# Patient Record
Sex: Female | Born: 1980 | Race: White | Hispanic: No | Marital: Married | Smoking: Never smoker
Health system: Southern US, Community
[De-identification: ages and names within clinical notes are randomized; demographics above are authoritative.]

## PROBLEM LIST (undated history)

## (undated) DIAGNOSIS — I471 Supraventricular tachycardia, unspecified: Secondary | ICD-10-CM

## (undated) DIAGNOSIS — I341 Nonrheumatic mitral (valve) prolapse: Secondary | ICD-10-CM

## (undated) DIAGNOSIS — I4891 Unspecified atrial fibrillation: Secondary | ICD-10-CM

## (undated) DIAGNOSIS — N809 Endometriosis, unspecified: Secondary | ICD-10-CM

## (undated) HISTORY — DX: Endometriosis, unspecified: N80.9

## (undated) HISTORY — PX: ABDOMINAL HYSTERECTOMY: SHX81

## (undated) HISTORY — PX: APPENDECTOMY: SHX54

## (undated) HISTORY — PX: MITRAL VALVE SURGERY: SHX714

---

## 2004-01-27 ENCOUNTER — Emergency Department: Payer: Self-pay | Admitting: General Practice

## 2004-04-15 ENCOUNTER — Emergency Department: Payer: Self-pay | Admitting: Emergency Medicine

## 2005-04-15 ENCOUNTER — Ambulatory Visit: Payer: Self-pay | Admitting: Urology

## 2005-10-09 ENCOUNTER — Emergency Department: Payer: Self-pay | Admitting: Emergency Medicine

## 2005-10-10 ENCOUNTER — Ambulatory Visit: Payer: Self-pay | Admitting: Emergency Medicine

## 2006-01-07 ENCOUNTER — Emergency Department (HOSPITAL_COMMUNITY): Admission: EM | Admit: 2006-01-07 | Discharge: 2006-01-07 | Payer: Self-pay | Admitting: Emergency Medicine

## 2006-02-12 ENCOUNTER — Emergency Department: Payer: Self-pay | Admitting: Emergency Medicine

## 2006-03-06 ENCOUNTER — Ambulatory Visit: Payer: Self-pay | Admitting: Internal Medicine

## 2006-03-17 ENCOUNTER — Ambulatory Visit: Payer: Self-pay | Admitting: Cardiovascular Disease

## 2006-11-27 ENCOUNTER — Ambulatory Visit: Payer: Self-pay | Admitting: Internal Medicine

## 2006-12-11 ENCOUNTER — Ambulatory Visit: Payer: Self-pay | Admitting: Internal Medicine

## 2007-02-19 ENCOUNTER — Other Ambulatory Visit: Payer: Self-pay

## 2007-02-19 ENCOUNTER — Emergency Department: Payer: Self-pay | Admitting: Emergency Medicine

## 2007-08-02 ENCOUNTER — Emergency Department: Payer: Self-pay | Admitting: Emergency Medicine

## 2008-04-17 ENCOUNTER — Emergency Department: Payer: Self-pay | Admitting: Emergency Medicine

## 2009-05-03 ENCOUNTER — Emergency Department: Payer: Self-pay | Admitting: Emergency Medicine

## 2009-05-14 ENCOUNTER — Emergency Department: Payer: Self-pay | Admitting: Emergency Medicine

## 2009-08-27 ENCOUNTER — Ambulatory Visit: Payer: Self-pay | Admitting: Family Medicine

## 2010-02-23 ENCOUNTER — Emergency Department: Payer: Self-pay | Admitting: Emergency Medicine

## 2010-04-10 ENCOUNTER — Emergency Department: Payer: Self-pay | Admitting: Emergency Medicine

## 2010-09-22 ENCOUNTER — Emergency Department: Payer: Self-pay | Admitting: Emergency Medicine

## 2011-02-03 ENCOUNTER — Emergency Department: Payer: Self-pay | Admitting: Emergency Medicine

## 2011-02-15 ENCOUNTER — Encounter: Payer: Self-pay | Admitting: *Deleted

## 2011-02-15 ENCOUNTER — Emergency Department (HOSPITAL_COMMUNITY)
Admission: EM | Admit: 2011-02-15 | Discharge: 2011-02-15 | Disposition: A | Discharge status: Home / Self Care | Payer: Self-pay | Attending: Emergency Medicine | Admitting: Emergency Medicine

## 2011-02-15 DIAGNOSIS — R509 Fever, unspecified: Secondary | ICD-10-CM | POA: Insufficient documentation

## 2011-02-15 DIAGNOSIS — J029 Acute pharyngitis, unspecified: Secondary | ICD-10-CM | POA: Insufficient documentation

## 2011-02-15 LAB — RAPID STREP SCREEN (MED CTR MEBANE ONLY): Streptococcus, Group A Screen (Direct): NEGATIVE

## 2011-02-15 MED ORDER — AZITHROMYCIN 250 MG PO TABS: ORAL_TABLET | ORAL | Status: DC

## 2011-02-15 NOTE — ED Notes (Signed)
Sore throat x 1 day. Fever up to 100.6. Son with similar symptoms.

## 2011-02-16 NOTE — ED Provider Notes (Signed)
History     CSN: [id number]  Arrival date & time 02/15/11  2232   First MD Initiated Contact with Patient 02/15/11 2248      Chief Complaint  Patient presents with  . Sore Throat    (Consider location/radiation/quality/duration/timing/severity/associated sxs/prior treatment) HPI Comments: .-year-old female who presents for sore throat, fever x2 days. Patient has a son with the same symptoms. No headache, no abdominal pain, no rash.  Pain is midline.  No vomiting, no diarrhea, minimal URI symptoms  Patient is a 30 y.o. female presenting with pharyngitis. The history is provided by the patient. No language interpreter was used.  Sore Throat This is a new problem. The current episode started yesterday. The problem occurs constantly. The problem has been gradually worsening. Pertinent negatives include no chest pain, no abdominal pain, no headaches and no shortness of breath. The symptoms are aggravated by nothing. The symptoms are relieved by nothing. She has tried acetaminophen for the symptoms. The treatment provided mild relief.    History reviewed. No pertinent past medical history.  History reviewed. No pertinent past surgical history.  No family history on file.  History  Substance Use Topics  . Smoking status: Not on file  . Smokeless tobacco: Not on file  . Alcohol Use: Not on file    OB History    Grav Para Term Preterm Abortions TAB SAB Ect Mult Living                  Review of Systems  Respiratory: Negative for shortness of breath.   Cardiovascular: Negative for chest pain.  Gastrointestinal: Negative for abdominal pain.  Neurological: Negative for headaches.  All other systems reviewed and are negative.    Allergies  Dilaudid; Morphine and related; and Penicillins  Home Medications   Current Outpatient Rx  Name Route Sig Dispense Refill  . AZITHROMYCIN 250 MG PO TABS  500 mg po on day one, then 250 mg po q day on days 2-5 6 tablet 0    BP 108/62   Pulse 76  Temp(Src) 97.1 F (36.2 C) (Oral)  Resp 16  SpO2 97%  Physical Exam  Constitutional: She appears well-developed and well-nourished.  HENT:  Head: Normocephalic.  Right Ear: External ear normal.  Left Ear: External ear normal.  Mouth/Throat: No oropharyngeal exudate.       Slight redness to throat  Eyes: Pupils are equal, round, and reactive to light.  Neck: Normal range of motion. Neck supple.  Cardiovascular: Normal rate, regular rhythm and normal heart sounds.   Pulmonary/Chest: Effort normal and breath sounds normal.  Abdominal: Soft.  Musculoskeletal: Normal range of motion.  Neurological: She is alert.  Skin: Skin is warm.    ED Course  Procedures (including critical care time)   Labs Reviewed  RAPID STREP SCREEN   No results found.   1. Pharyngitis       MDM  30 year old female who presents for sore throat, fever. The symptoms started yesterday. Possible strep throat we'll send strep test. Possible viral syndrome.  Strep test is negative however her son strep test is positive. Given the close sick contacts in the family will treat mother anyway with azithromycin she's allergic to penicillin. Discussed signs that warrant reevaluation the        Chrystine Oiler, MD 02/16/11 [id number]

## 2011-05-08 ENCOUNTER — Emergency Department: Payer: Self-pay | Admitting: Unknown Physician Specialty

## 2011-05-08 LAB — BASIC METABOLIC PANEL
Calcium, Total: 8.8 mg/dL (ref 8.5–10.1)
Co2: 24 mmol/L (ref 21–32)
EGFR (Non-African Amer.): >60
Glucose: 113 mg/dL — ABNORMAL HIGH (ref 65–99)
Osmolality: 284 (ref 275–301)
Potassium: 3.7 mmol/L (ref 3.5–5.1)

## 2011-05-08 LAB — CBC
HGB: 14.5 g/dL (ref 12.0–16.0)
MCH: 30.1 pg (ref 26.0–34.0)
MCHC: 34.7 g/dL (ref 32.0–36.0)
Platelet: 333 10*3/uL (ref 150–440)
RBC: 4.83 10*6/uL (ref 3.80–5.20)
RDW: 12.2 % (ref 11.5–14.5)

## 2012-12-18 ENCOUNTER — Emergency Department: Payer: Self-pay | Admitting: Emergency Medicine

## 2012-12-18 LAB — COMPREHENSIVE METABOLIC PANEL
Albumin: 4 g/dL (ref 3.4–5.0)
Alkaline Phosphatase: 69 U/L (ref 50–136)
BUN: 13 mg/dL (ref 7–18)
Chloride: 107 mmol/L (ref 98–107)
Co2: 25 mmol/L (ref 21–32)
EGFR (Non-African Amer.): >60
Glucose: 104 mg/dL — ABNORMAL HIGH (ref 65–99)
Osmolality: 276 (ref 275–301)
Potassium: 3.8 mmol/L (ref 3.5–5.1)
SGOT(AST): 19 U/L (ref 15–37)
Sodium: 138 mmol/L (ref 136–145)

## 2012-12-18 LAB — CBC
HCT: 37.4 % (ref 35.0–47.0)
HGB: 13.1 g/dL (ref 12.0–16.0)
MCH: 29.7 pg (ref 26.0–34.0)
MCHC: 34.9 g/dL (ref 32.0–36.0)
MCV: 85 fL (ref 80–100)
RDW: 12.5 % (ref 11.5–14.5)

## 2012-12-18 LAB — URINALYSIS, COMPLETE
Ph: 7 (ref 4.5–8.0)
RBC,UR: <1 /HPF (ref 0–5)
Specific Gravity: 1.003 (ref 1.003–1.030)

## 2013-05-02 DIAGNOSIS — E049 Nontoxic goiter, unspecified: Secondary | ICD-10-CM | POA: Insufficient documentation

## 2013-05-02 DIAGNOSIS — Z8679 Personal history of other diseases of the circulatory system: Secondary | ICD-10-CM | POA: Insufficient documentation

## 2013-09-15 ENCOUNTER — Ambulatory Visit: Payer: Self-pay | Admitting: Physician Assistant

## 2013-09-15 LAB — RAPID STREP-A WITH REFLX: MICRO TEXT REPORT: NEGATIVE

## 2013-09-17 LAB — BETA STREP CULTURE(ARMC)

## 2014-01-04 ENCOUNTER — Ambulatory Visit: Payer: Self-pay | Admitting: Emergency Medicine

## 2014-04-04 ENCOUNTER — Encounter (HOSPITAL_COMMUNITY): Payer: Self-pay | Admitting: Emergency Medicine

## 2014-04-04 ENCOUNTER — Emergency Department (HOSPITAL_COMMUNITY)
Admission: EM | Admit: 2014-04-04 | Discharge: 2014-04-04 | Disposition: A | Discharge status: Home / Self Care | Payer: Self-pay | Attending: Emergency Medicine | Admitting: Emergency Medicine

## 2014-04-04 ENCOUNTER — Emergency Department (HOSPITAL_COMMUNITY): Payer: Self-pay

## 2014-04-04 DIAGNOSIS — Z91040 Latex allergy status: Secondary | ICD-10-CM | POA: Insufficient documentation

## 2014-04-04 DIAGNOSIS — Z880 Allergy status to penicillin: Secondary | ICD-10-CM | POA: Insufficient documentation

## 2014-04-04 DIAGNOSIS — R109 Unspecified abdominal pain: Secondary | ICD-10-CM

## 2014-04-04 DIAGNOSIS — R1030 Lower abdominal pain, unspecified: Secondary | ICD-10-CM | POA: Insufficient documentation

## 2014-04-04 DIAGNOSIS — R112 Nausea with vomiting, unspecified: Secondary | ICD-10-CM | POA: Insufficient documentation

## 2014-04-04 LAB — URINALYSIS, ROUTINE W REFLEX MICROSCOPIC
Bilirubin Urine: NEGATIVE
Glucose, UA: NEGATIVE mg/dL
Hgb urine dipstick: NEGATIVE
Ketones, ur: 15 mg/dL — AB
LEUKOCYTES UA: NEGATIVE
NITRITE: NEGATIVE
PH: 6.5 (ref 5.0–8.0)
Protein, ur: NEGATIVE mg/dL
SPECIFIC GRAVITY, URINE: 1.027 (ref 1.005–1.030)
UROBILINOGEN UA: 1 mg/dL (ref 0.0–1.0)

## 2014-04-04 LAB — CBC WITH DIFFERENTIAL/PLATELET
BASOS ABS: 0.1 10*3/uL (ref 0.0–0.1)
BASOS PCT: 1 % (ref 0–1)
Eosinophils Absolute: 0.1 10*3/uL (ref 0.0–0.7)
Eosinophils Relative: 2 % (ref 0–5)
HEMATOCRIT: 39.3 % (ref 36.0–46.0)
HEMOGLOBIN: 13.5 g/dL (ref 12.0–15.0)
LYMPHS PCT: 36 % (ref 12–46)
Lymphs Abs: 3 10*3/uL (ref 0.7–4.0)
MCH: 28.4 pg (ref 26.0–34.0)
MCHC: 34.4 g/dL (ref 30.0–36.0)
MCV: 82.7 fL (ref 78.0–100.0)
MONO ABS: 0.5 10*3/uL (ref 0.1–1.0)
Monocytes Relative: 6 % (ref 3–12)
NEUTROS ABS: 4.6 10*3/uL (ref 1.7–7.7)
NEUTROS PCT: 55 % (ref 43–77)
Platelets: 345 10*3/uL (ref 150–400)
RBC: 4.75 MIL/uL (ref 3.87–5.11)
RDW: 11.9 % (ref 11.5–15.5)
WBC: 8.3 10*3/uL (ref 4.0–10.5)

## 2014-04-04 LAB — COMPREHENSIVE METABOLIC PANEL
ALBUMIN: 4.1 g/dL (ref 3.5–5.2)
ALK PHOS: 71 U/L (ref 39–117)
ALT: 15 U/L (ref 0–35)
ANION GAP: 8 (ref 5–15)
AST: 19 U/L (ref 0–37)
BILIRUBIN TOTAL: 1.1 mg/dL (ref 0.3–1.2)
BUN: 11 mg/dL (ref 6–23)
CO2: 26 mmol/L (ref 19–32)
CREATININE: 0.84 mg/dL (ref 0.50–1.10)
Calcium: 9.4 mg/dL (ref 8.4–10.5)
Chloride: 104 mmol/L (ref 96–112)
GFR calc non Af Amer: 88 mL/min — ABNORMAL LOW (ref >90)
GLUCOSE: 133 mg/dL — AB (ref 70–99)
POTASSIUM: 3.4 mmol/L — AB (ref 3.5–5.1)
Sodium: 138 mmol/L (ref 135–145)
TOTAL PROTEIN: 7.2 g/dL (ref 6.0–8.3)

## 2014-04-04 MED ORDER — SODIUM CHLORIDE 0.9 % IV BOLUS (SEPSIS)
500.0000 mL | Freq: Once | INTRAVENOUS | Status: AC
  Administered 2014-04-04: 500 mL via INTRAVENOUS

## 2014-04-04 MED ORDER — SODIUM CHLORIDE 0.9 % IV SOLN
Freq: Once | INTRAVENOUS | Status: AC
  Administered 2014-04-04: 50 mL/h via INTRAVENOUS

## 2014-04-04 MED ORDER — ONDANSETRON HCL 4 MG/2ML IJ SOLN
4.0000 mg | Freq: Once | INTRAMUSCULAR | Status: AC
  Administered 2014-04-04: 4 mg via INTRAVENOUS
  Filled 2014-04-04: qty 2

## 2014-04-04 MED ORDER — FENTANYL CITRATE 0.05 MG/ML IJ SOLN
50.0000 ug | Freq: Once | INTRAMUSCULAR | Status: AC
  Administered 2014-04-04: 50 ug via INTRAVENOUS
  Filled 2014-04-04: qty 2

## 2014-04-04 MED ORDER — ONDANSETRON HCL 4 MG PO TABS: 4.0000 mg | ORAL_TABLET | Freq: Four times a day (QID) | ORAL | Status: DC

## 2014-04-04 MED ORDER — TRAMADOL HCL 50 MG PO TABS: 50.0000 mg | ORAL_TABLET | Freq: Four times a day (QID) | ORAL | Status: DC | PRN

## 2014-04-04 MED ORDER — HYDROMORPHONE HCL 1 MG/ML IJ SOLN
0.5000 mg | Freq: Once | INTRAMUSCULAR | Status: AC
  Administered 2014-04-04: 0.5 mg via INTRAVENOUS
  Filled 2014-04-04: qty 1

## 2014-04-04 NOTE — Discharge Instructions (Signed)

## 2014-04-04 NOTE — ED Provider Notes (Signed)
Patient signed out to me by Manus RuddSchulz, NP.  Patient with right sided abdominal pain.  S/p appendectomy, hysterectomy.  Labs are reassuring.  No elevation of LFTs.  CT is negative.  US negative.    DC to home with return precautions.  Patient states that she does not do well with pain medications, but has had success with tramadol.  Patient instructed to return for:  New or worsening symptoms, including, increased abdominal pain, especially pain that localizes to one side, bloody vomit, bloody diarrhea, fever >101, and intractable vomiting.   Roxy Horsemanobert Tommey Barret, PA-C 04/04/14 0840  Tomasita CrumbleAdeleke Oni, MD 04/04/14 [phone]

## 2014-04-04 NOTE — ED Notes (Signed)
Patient transported to CT 

## 2014-04-04 NOTE — ED Notes (Signed)
Pt. reports RLQ pain with nausea and vomitting onset yesterday , denies diarrhea , no fever or chills.

## 2014-04-04 NOTE — ED Provider Notes (Signed)
CSN: [id number]     Arrival date & time 04/04/14  0035 History   First MD Initiated Contact with Patient 04/04/14 0132     Chief Complaint  Patient presents with  . Abdominal Pain     (Consider location/radiation/quality/duration/timing/severity/associated sxs/prior Treatment) HPI Comments: This is 34 year old female with a history of 24 hours of right lower quadrant pain.  That's been constant but waxed and waned in intensity.  She reports nausea and vomiting.  Denies any diarrhea, constipation, states she has been taking ibuprofen with little relief She is status post hysterectomy, appendectomy.  Denies any dysuria, history of kidney stones  Patient is a 34 y.o. female presenting with abdominal pain. The history is provided by the patient.  Abdominal Pain Pain location:  RUQ Pain quality: aching and cramping   Pain radiates to:  Does not radiate Pain severity:  Moderate Onset quality:  Gradual Duration:  24 hours Timing:  Constant Progression:  Waxing and waning Chronicity:  New Context: not laxative use and not retching   Relieved by:  Nothing Worsened by:  Nothing tried Ineffective treatments:  NSAIDs Associated symptoms: nausea and vomiting   Associated symptoms: no chest pain, no chills, no constipation, no cough, no diarrhea, no dysuria and no fever   Nausea:    Severity:  Moderate   Onset quality:  Gradual Vomiting:    Quality:  Bilious material Risk factors: no alcohol abuse and no aspirin use     History reviewed. No pertinent past medical history. History reviewed. No pertinent past surgical history. No family history on file. History  Substance Use Topics  . Smoking status: Never Smoker   . Smokeless tobacco: Not on file  . Alcohol Use: No   OB History    No data available     Review of Systems  Constitutional: Negative for fever and chills.  Respiratory: Negative for cough.   Cardiovascular: Negative for chest pain.  Gastrointestinal: Positive for  nausea, vomiting and abdominal pain. Negative for diarrhea and constipation.  Genitourinary: Positive for flank pain. Negative for dysuria and frequency.  Skin: Negative for rash.  All other systems reviewed and are negative.     Allergies  Latex and Penicillins  Home Medications   Prior to Admission medications   Medication Sig Start Date End Date Taking? Authorizing Provider  azithromycin (ZITHROMAX Z-PAK) 250 MG tablet 500 mg po on day one, then 250 mg po q day on days 2-5 Patient not taking: Reported on 04/04/2014 02/15/11   Chrystine Oileross J Kuhner, MD   BP 107/70 mmHg  Pulse 65  Temp(Src) 98.6 F (37 C) (Oral)  Resp 20  Ht 5\' 4"  (1.626 m)  Wt 169 lb (76.658 kg)  BMI 28.99 kg/m2  SpO2 98% Physical Exam  Constitutional: She appears well-developed and well-nourished.  HENT:  Head: Normocephalic.  Eyes: Pupils are equal, round, and reactive to light.  Neck: Normal range of motion.  Cardiovascular: Normal rate.   Pulmonary/Chest: Effort normal.  Abdominal: Soft. Bowel sounds are normal. She exhibits no distension. There is tenderness.  Musculoskeletal: Normal range of motion.  Neurological: She is alert.  Skin: Skin is warm and dry. No rash noted.  Nursing note and vitals reviewed.   ED Course  Procedures (including critical care time) Labs Review Labs Reviewed  COMPREHENSIVE METABOLIC PANEL - Abnormal; Notable for the following:    Potassium 3.4 (*)    Glucose, Bld 133 (*)    GFR calc non Af Amer 88 (*)  All other components within normal limits  URINALYSIS, ROUTINE W REFLEX MICROSCOPIC - Abnormal; Notable for the following:    Ketones, ur 15 (*)    All other components within normal limits  CBC WITH DIFFERENTIAL/PLATELET    Imaging Review Ct Renal Stone Study  04/04/2014   CLINICAL DATA:  RIGHT lower quadrant pain with nausea and vomiting beginning April 03, 2004 at 7:30 a.m. , now intense and unbearable. History of kidney stones.  EXAM: CT ABDOMEN AND PELVIS  WITHOUT CONTRAST  TECHNIQUE: Multidetector CT imaging of the abdomen and pelvis was performed following the standard protocol without IV contrast.  COMPARISON:  None.  FINDINGS: LUNG BASES: Included view of the lung bases are clear. The visualized heart and pericardium are unremarkable.  KIDNEYS/BLADDER: Kidneys are orthotopic, demonstrating normal size and morphology. Punctate RIGHT interpolar nephrolithiasis. No hydronephrosis; limited assessment for renal masses on this nonenhanced examination. The unopacified ureters are normal in course and caliber. Urinary bladder is partially distended and unremarkable.  SOLID ORGANS: The liver, spleen, gallbladder, pancreas and adrenal glands are unremarkable for this non-contrast examination.  GASTROINTESTINAL TRACT: The stomach, small and large bowel are normal in course and caliber without inflammatory changes, the sensitivity may be decreased by lack of enteric contrast. Status post appendectomy.  PERITONEUM/RETROPERITONEUM: Aortoiliac vessels are normal in course and caliber. No lymphadenopathy by CT size criteria. Status post hysterectomy. No intraperitoneal free fluid nor free air. Phleboliths in the pelvis.  SOFT TISSUES/ OSSEOUS STRUCTURES: Nonsuspicious. Mild degenerative changes sacroiliac joints. Ovarian vein phleboliths.  IMPRESSION: Punctate RIGHT interpolar nephrolithiasis. No obstructive uropathy nor acute intra-abdominal/pelvic process. Status post appendectomy.   Electronically Signed   By: Awilda Metro   On: 04/04/2014 03:28     EKG Interpretation None     renal stone study shows a small punctate interpolar stone.  No stone within the ureter or bladder.  Lab work is insignificant.  Urine shows no sign of infection.  No hemoglobin patient was given fentanyl and Dilaudid and is feeling much better Patient unhappy with findings and demanding pelvic ultrasound   MDM   Final diagnoses:  Flank pain         Arman Filter, NP 04/04/14  1610  Tomasita Crumble, MD 04/04/14 1449

## 2014-04-12 ENCOUNTER — Emergency Department: Payer: Self-pay | Admitting: Emergency Medicine

## 2014-04-26 ENCOUNTER — Emergency Department: Payer: Self-pay | Admitting: Emergency Medicine

## 2014-12-25 ENCOUNTER — Ambulatory Visit (INDEPENDENT_AMBULATORY_CARE_PROVIDER_SITE_OTHER): Payer: Self-pay

## 2014-12-25 ENCOUNTER — Encounter: Payer: Self-pay | Admitting: Emergency Medicine

## 2014-12-25 ENCOUNTER — Ambulatory Visit
Admission: EM | Admit: 2014-12-25 | Discharge: 2014-12-25 | Disposition: A | Discharge status: Home / Self Care | Payer: Self-pay | Attending: Family Medicine | Admitting: Family Medicine

## 2014-12-25 DIAGNOSIS — J40 Bronchitis, not specified as acute or chronic: Secondary | ICD-10-CM

## 2014-12-25 DIAGNOSIS — K2970 Gastritis, unspecified, without bleeding: Secondary | ICD-10-CM

## 2014-12-25 DIAGNOSIS — J0100 Acute maxillary sinusitis, unspecified: Secondary | ICD-10-CM

## 2014-12-25 MED ORDER — CEFUROXIME AXETIL 500 MG PO TABS: 500.0000 mg | ORAL_TABLET | Freq: Two times a day (BID) | ORAL | Status: DC

## 2014-12-25 MED ORDER — ONDANSETRON 8 MG PO TBDP
8.0000 mg | ORAL_TABLET | Freq: Once | ORAL | Status: AC
  Administered 2014-12-25: 8 mg via ORAL

## 2014-12-25 MED ORDER — FEXOFENADINE-PSEUDOEPHED ER 180-240 MG PO TB24: 1.0000 | ORAL_TABLET | Freq: Every day | ORAL | Status: DC

## 2014-12-25 MED ORDER — HYDROCOD POLST-CPM POLST ER 10-8 MG/5ML PO SUER: 5.0000 mL | Freq: Two times a day (BID) | ORAL | Status: DC | PRN

## 2014-12-25 MED ORDER — ONDANSETRON 8 MG PO TBDP: 8.0000 mg | ORAL_TABLET | Freq: Three times a day (TID) | ORAL | Status: DC | PRN

## 2014-12-25 NOTE — Discharge Instructions (Signed)
Gastritis, Adult Gastritis is soreness and puffiness (inflammation) of the lining of the stomach. If you do not get help, gastritis can cause bleeding and sores (ulcers) in the stomach. HOME CARE   Only take medicine as told by your doctor.  If you were given antibiotic medicines, take them as told. Finish the medicines even if you start to feel better.  Drink enough fluids to keep your pee (urine) clear or pale yellow.  Avoid foods and drinks that make your problems worse. Foods you may want to avoid include:  Caffeine or alcohol.  Chocolate.  Mint.  Garlic and onions.  Spicy foods.  Citrus fruits, including oranges, lemons, or limes.  Food containing tomatoes, including sauce, chili, salsa, and pizza.  Fried and fatty foods.  Eat small meals throughout the day instead of large meals. GET HELP RIGHT AWAY IF:   You have black or dark red poop (stools).  You throw up (vomit) blood. It may look like coffee grounds.  You cannot keep fluids down.  Your belly (abdominal) pain gets worse.  You have a fever.  You do not feel better after 1 week.  You have any other questions or concerns. MAKE SURE YOU:   Understand these instructions.  Will watch your condition.  Will get help right away if you are not doing well or get worse.   This information is not intended to replace advice given to you by your health care provider. Make sure you discuss any questions you have with your health care provider.   Document Released: 07/27/2007 Document Revised: 05/02/2011 Document Reviewed: 03/23/2011 Elsevier Interactive Patient Education 2016 [employer]  Sinusitis, Adult Sinusitis is redness, soreness, and puffiness (inflammation) of the air pockets in the bones of your face (sinuses). The redness, soreness, and puffiness can cause air and mucus to get trapped in your sinuses. This can allow germs to grow and cause an infection.  HOME CARE   Drink enough fluids to keep  your pee (urine) clear or pale yellow.  Use a humidifier in your home.  Run a hot shower to create steam in the bathroom. Sit in the bathroom with the door closed. Breathe in the steam 3-4 times a day.  Put a warm, moist washcloth on your face 3-4 times a day, or as told by your doctor.  Use salt water sprays (saline sprays) to wet the thick fluid in your nose. This can help the sinuses drain.  Only take medicine as told by your doctor. GET HELP RIGHT AWAY IF:   Your pain gets worse.  You have very bad headaches.  You are sick to your stomach (nauseous).  You throw up (vomit).  You are very sleepy (drowsy) all the time.  Your face is puffy (swollen).  Your vision changes.  You have a stiff neck.  You have trouble breathing. MAKE SURE YOU:   Understand these instructions.  Will watch your condition.  Will get help right away if you are not doing well or get worse.   This information is not intended to replace advice given to you by your health care provider. Make sure you discuss any questions you have with your health care provider.   Document Released: 07/27/2007 Document Revised: 02/28/2014 Document Reviewed: 09/13/2011 Elsevier Interactive Patient Education 2016 [employer] Inc[employer]  Upper Respiratory Infection, Adult Most upper respiratory infections (URIs) are caused by a virus. A URI affects the nose, throat, and upper air passages. The most common type of URI is often called "  the common cold." HOME CARE   Take medicines only as told by your doctor.  Gargle warm saltwater or take cough drops to comfort your throat as told by your doctor.  Use a warm mist humidifier or inhale steam from a shower to increase air moisture. This may make it easier to breathe.  Drink enough fluid to keep your pee (urine) clear or pale yellow.  Eat soups and other clear broths.  Have a healthy diet.  Rest as needed.  Go back to work when your fever is gone or your doctor says it  is okay.  You may need to stay home longer to avoid giving your URI to others.  You can also wear a face mask and wash your hands often to prevent spread of the virus.  Use your inhaler more if you have asthma.  Do not use any tobacco products, including cigarettes, chewing tobacco, or electronic cigarettes. If you need help quitting, ask your doctor. GET HELP IF:  You are getting worse, not better.  Your symptoms are not helped by medicine.  You have chills.  You are getting more short of breath.  You have brown or red mucus.  You have yellow or brown discharge from your nose.  You have pain in your face, especially when you bend forward.  You have a fever.  You have puffy (swollen) neck glands.  You have pain while swallowing.  You have white areas in the back of your throat. GET HELP RIGHT AWAY IF:   You have very bad or constant:  Headache.  Ear pain.  Pain in your forehead, behind your eyes, and over your cheekbones (sinus pain).  Chest pain.  You have long-lasting (chronic) lung disease and any of the following:  Wheezing.  Long-lasting cough.  Coughing up blood.  A change in your usual mucus.  You have a stiff neck.  You have changes in your:  Vision.  Hearing.  Thinking.  Mood. MAKE SURE YOU:   Understand these instructions.  Will watch your condition.  Will get help right away if you are not doing well or get worse.   This information is not intended to replace advice given to you by your health care provider. Make sure you discuss any questions you have with your health care provider.   Document Released: 07/27/2007 Document Revised: 06/24/2014 Document Reviewed: 05/15/2013 Elsevier Interactive Patient Education [employer]

## 2014-12-25 NOTE — ED Notes (Signed)
Patient states she has been congested for several weeks and this morning was very nauseated and vomited x 10.

## 2014-12-25 NOTE — ED Provider Notes (Signed)
CSN: [id number]     Arrival date & time 12/25/14  1105 History   First MD Initiated Contact with Patient 12/25/14 1213    Nurses notes were reviewed. Chief Complaint  Patient presents with  . Nasal Congestion   patient reports having symptoms of gastritis today nausea with one episode of vomiting but for the last 18 days having nasal congestion coughing running nose. She reports some wheezing and some fever and sore throat sore throat has gotten better. Still has facial pain is still having shortness shortness of breath and congestion as well. (Consider location/radiation/quality/duration/timing/severity/associated sxs/prior Treatment) Patient is a [age] y.o. female presenting with URI and cramps. The history is provided by the patient. No language interpreter was used.  URI Presenting symptoms: congestion, cough, facial pain, fever and rhinorrhea   Congestion:    Location:  Nasal   Interferes with sleep: yes   Cough:    Cough characteristics:  Productive, non-productive and harsh   Sputum characteristics:  Brown and green   Severity:  Moderate   Duration:  18 days   Timing:  Constant   Progression:  Waxing and waning   Chronicity:  New Fever:    Timing:  Intermittent   Temp source:  Subjective Severity:  Moderate Duration:  18 days Timing:  Constant Progression:  Worsening Chronicity:  New Relieved by:  Nothing Worsened by:  Nothing tried Ineffective treatments:  Decongestant Associated symptoms: sinus pain   Associated symptoms: no headaches   Risk factors: not elderly, no chronic cardiac disease and no chronic kidney disease   Abdominal Cramping This is a new problem. The current episode started yesterday. Associated symptoms include abdominal pain and shortness of breath. Pertinent negatives include no headaches. Nothing aggravates the symptoms. Nothing relieves the symptoms. She has tried nothing for the symptoms.    History reviewed. No pertinent past medical  history. History reviewed. No pertinent past surgical history. History reviewed. No pertinent family history. Social History  Substance Use Topics  . Smoking status: Never Smoker   . Smokeless tobacco: None  . Alcohol Use: No   OB History    No data available     Review of Systems  Constitutional: Positive for fever.  HENT: Positive for congestion and rhinorrhea.   Respiratory: Positive for cough and shortness of breath.   Gastrointestinal: Positive for abdominal pain.  Neurological: Negative for headaches.  All other systems reviewed and are negative.   Allergies  Latex and Penicillins  Home Medications   Prior to Admission medications   Medication Sig Start Date End Date Taking? Authorizing Provider  azithromycin (ZITHROMAX Z-PAK) 250 MG tablet 500 mg po on day one, then 250 mg po q day on days 2-5 Patient not taking: Reported on 04/04/2014 02/15/11   Niel Hummeross Kuhner, MD  ondansetron (ZOFRAN) 4 MG tablet Take 1 tablet (4 mg total) by mouth every 6 (six) hours. 04/04/14   Roxy Horsemanobert Browning, PA-C  traMADol (ULTRAM) 50 MG tablet Take 1 tablet (50 mg total) by mouth every 6 (six) hours as needed. 04/04/14   Roxy Horsemanobert Browning, PA-C   Meds Ordered and Administered this Visit   Medications  ondansetron (ZOFRAN-ODT) disintegrating tablet 8 mg (8 mg Oral Given 12/25/14 1335)    BP 123/77 mmHg  Pulse 72  Temp(Src) 98.1 F (36.7 C) (Oral)  Resp 18  Ht 5\' 4"  (1.626 m)  Wt 169 lb (76.658 kg)  BMI 28.99 kg/m2  SpO2 99% No data found.   Physical Exam  Constitutional: She is  oriented to person, place, and time. She appears well-developed and well-nourished.  HENT:  Head: Normocephalic and atraumatic.  Eyes: Pupils are equal, round, and reactive to light.  Neck: Normal range of motion. Neck supple.  Cardiovascular: Normal rate, regular rhythm and normal heart sounds.   Pulmonary/Chest: Effort normal and breath sounds normal. No respiratory distress. She has no wheezes.  Abdominal:  Soft. She exhibits no distension. There is no tenderness.  Musculoskeletal: Normal range of motion. She exhibits no edema.  Neurological: She is alert and oriented to person, place, and time.  Skin: Skin is warm and dry.  Psychiatric: She has a normal mood and affect.  Vitals reviewed.   ED Course  Procedures (including critical care time)  Labs Review Labs Reviewed - No data to display  Imaging Review Dg Chest 2 View  12/25/2014  CLINICAL DATA:  Cough, shortness of breath for 2-1/2 weeks EXAM: CHEST  2 VIEW COMPARISON:  CT chest of 04/12/2014 and portable chest x-ray of the same day FINDINGS: No active infiltrate or effusion is seen. Mediastinal and hilar contours are unremarkable. The heart is within normal limits in size. No bony abnormality is seen. IMPRESSION: No active cardiopulmonary disease. Electronically Signed   By: Dwyane Dee M.D.   On: 12/25/2014 13:28     Visual Acuity Review  Right Eye Distance:   Left Eye Distance:   Bilateral Distance:    Right Eye Near:   Left Eye Near:    Bilateral Near:         MDM   1. Acute maxillary sinusitis, recurrence not specified   2. Bronchitis   3. Gastritis     Work note will be given for the next 2 days chest x-rays negative. We'll place on Tussionex 1 teaspoon twice a day Allegra-D daily and Zofran when necessary for nausea and Ceftin 500 mg twice day for the bronchitis and sinusitis. See PCP if not better in a week.  Hassan Rowan, MD 12/25/14 [phone]

## 2015-08-27 ENCOUNTER — Emergency Department
Admission: EM | Admit: 2015-08-27 | Discharge: 2015-08-28 | Disposition: A | Discharge status: Home / Self Care | Payer: Self-pay | Attending: Emergency Medicine | Admitting: Emergency Medicine

## 2015-08-27 ENCOUNTER — Encounter: Payer: Self-pay | Admitting: Emergency Medicine

## 2015-08-27 ENCOUNTER — Emergency Department: Payer: Self-pay

## 2015-08-27 DIAGNOSIS — R079 Chest pain, unspecified: Secondary | ICD-10-CM

## 2015-08-27 DIAGNOSIS — R0789 Other chest pain: Secondary | ICD-10-CM | POA: Insufficient documentation

## 2015-08-27 LAB — COMPREHENSIVE METABOLIC PANEL
ALBUMIN: 4.2 g/dL (ref 3.5–5.0)
ALK PHOS: 74 U/L (ref 38–126)
ALT: 39 U/L (ref 14–54)
ANION GAP: 8 (ref 5–15)
AST: 34 U/L (ref 15–41)
BUN: 16 mg/dL (ref 6–20)
CALCIUM: 8.8 mg/dL — AB (ref 8.9–10.3)
CO2: 24 mmol/L (ref 22–32)
Chloride: 105 mmol/L (ref 101–111)
Creatinine, Ser: 1.25 mg/dL — ABNORMAL HIGH (ref 0.44–1.00)
GFR calc non Af Amer: 54 mL/min — ABNORMAL LOW (ref >60)
Glucose, Bld: 106 mg/dL — ABNORMAL HIGH (ref 65–99)
POTASSIUM: 3.2 mmol/L — AB (ref 3.5–5.1)
SODIUM: 137 mmol/L (ref 135–145)
TOTAL PROTEIN: 7.3 g/dL (ref 6.5–8.1)
Total Bilirubin: 0.9 mg/dL (ref 0.3–1.2)

## 2015-08-27 LAB — CBC
HEMATOCRIT: 39 % (ref 35.0–47.0)
HEMOGLOBIN: 13.5 g/dL (ref 12.0–16.0)
MCH: 28.8 pg (ref 26.0–34.0)
MCHC: 34.6 g/dL (ref 32.0–36.0)
MCV: 83.3 fL (ref 80.0–100.0)
Platelets: 332 10*3/uL (ref 150–440)
RBC: 4.68 MIL/uL (ref 3.80–5.20)
RDW: 12.7 % (ref 11.5–14.5)
WBC: 9.2 10*3/uL (ref 3.6–11.0)

## 2015-08-27 LAB — TROPONIN I: Troponin I: <0.03 ng/mL (ref <0.03)

## 2015-08-27 NOTE — ED Notes (Signed)
Pt arrived to the ED for complaints of chest pain that started about a month ago. Pt reports feeling light headed, dizzy and short of breath. Pt reports feeling a lot of pressure on her chest. Pt is AOx4 mildly anxious during triage.

## 2015-08-28 LAB — TROPONIN I

## 2015-08-28 LAB — TSH: TSH: 1.955 u[IU]/mL (ref 0.350–4.500)

## 2015-08-28 LAB — FIBRIN DERIVATIVES D-DIMER (ARMC ONLY): Fibrin derivatives D-dimer (ARMC): 186 (ref 0–499)

## 2015-08-28 NOTE — ED Provider Notes (Signed)
[facility] Emergency Department Provider Note  ____________________________________________  Time seen: 12:05 AM  I have reviewed the triage vital signs and the nursing notes.   HISTORY  Chief Complaint Chest Pain     HPI Shelby Crosby is a 35 y.o. female presents with intermittent chest pain left-sided lasting less than a minute times one month. Patient states the pain feels sharp and spontaneously resolves on its own patient admits to dyspnea at the time of the discomfort. Patient has no pain at present. Patient denies any dyspnea at this time as well. Patient denies any history of DVT or PE. Patient denies any cardiac disease history. However does admit that father and grandfather had heart disease.    Past medical history None There are no active problems to display for this patient.   Past surgical history None  Current Outpatient Rx  Name  Route  Sig  Dispense  Refill  . azithromycin (ZITHROMAX Z-PAK) 250 MG tablet      500 mg po on day one, then 250 mg po q day on days 2-5 Patient not taking: Reported on 04/04/2014   6 tablet   0   . cefUROXime (CEFTIN) 500 MG tablet   Oral   Take 1 tablet (500 mg total) by mouth 2 (two) times daily.   20 tablet   0   . chlorpheniramine-HYDROcodone (TUSSIONEX PENNKINETIC ER) 10-8 MG/5ML SUER   Oral   Take 5 mLs by mouth every 12 (twelve) hours as needed for cough.   115 mL   0   . fexofenadine-pseudoephedrine (ALLEGRA-D ALLERGY & CONGESTION) 180-240 MG 24 hr tablet   Oral   Take 1 tablet by mouth daily.   30 tablet   0   . ondansetron (ZOFRAN ODT) 8 MG disintegrating tablet   Oral   Take 1 tablet (8 mg total) by mouth every 8 (eight) hours as needed for nausea or vomiting.   20 tablet   0   . ondansetron (ZOFRAN) 4 MG tablet   Oral   Take 1 tablet (4 mg total) by mouth every 6 (six) hours.   12 tablet   0   . traMADol (ULTRAM) 50 MG tablet   Oral   Take 1 tablet (50 mg total) by  mouth every 6 (six) hours as needed.   15 tablet   0     Allergies Latex and Penicillins  History reviewed. No pertinent family history.  Social History Social History  Substance Use Topics  . Smoking status: Never Smoker   . Smokeless tobacco: None  . Alcohol Use: No    Review of Systems  Constitutional: Negative for fever. Eyes: Negative for visual changes. ENT: Negative for sore throat. Cardiovascular: Positive for chest pain. Respiratory: Negative for shortness of breath. Gastrointestinal: Negative for abdominal pain, vomiting and diarrhea. Genitourinary: Negative for dysuria. Musculoskeletal: Negative for back pain. Skin: Negative for rash. Neurological: Negative for headaches, focal weakness or numbness.   10-point ROS otherwise negative.  ____________________________________________   PHYSICAL EXAM:  VITAL SIGNS: ED Triage Vitals  Enc Vitals Group     BP 08/27/15 2130 133/85 mmHg     Pulse Rate 08/27/15 2130 85     Resp 08/27/15 2130 20     Temp 08/27/15 2130 98.5 F (36.9 C)     Temp Source 08/27/15 2130 Oral     SpO2 08/27/15 2130 98 %     Weight 08/27/15 2130 172 lb (78.019 kg)  Height 08/27/15 2130 5\' 4"  (1.626 m)     Head Cir --      Peak Flow --      Pain Score 08/27/15 2135 2     Pain Loc --      Pain Edu? --      Excl. in GC? --      Constitutional: Alert and oriented. Well appearing and in no distress. Eyes: Conjunctivae are normal. PERRL. Normal extraocular movements. ENT   Head: Normocephalic and atraumatic.   Nose: No congestion/rhinnorhea.   Mouth/Throat: Mucous membranes are moist.   Neck: No stridor. Hematological/Lymphatic/Immunilogical: No cervical lymphadenopathy. Cardiovascular: Normal rate, regular rhythm. Normal and symmetric distal pulses are present in all extremities. No murmurs, rubs, or gallops. Respiratory: Normal respiratory effort without tachypnea nor retractions. Breath sounds are clear and  equal bilaterally. No wheezes/rales/rhonchi. Gastrointestinal: Soft and nontender. No distention. There is no CVA tenderness. Genitourinary: deferred Musculoskeletal: Nontender with normal range of motion in all extremities. No joint effusions.  No lower extremity tenderness nor edema. Neurologic:  Normal speech and language. No gross focal neurologic deficits are appreciated. Speech is normal.  Skin:  Skin is warm, dry and intact. No rash noted. Psychiatric: Mood and affect are normal. Speech and behavior are normal. Patient exhibits appropriate insight and judgment.  ____________________________________________    LABS (pertinent positives/negatives)  Labs Reviewed  COMPREHENSIVE METABOLIC PANEL - Abnormal; Notable for the following:    Potassium 3.2 (*)    Glucose, Bld 106 (*)    Creatinine, Ser 1.25 (*)    Calcium 8.8 (*)    GFR calc non Af Amer 54 (*)    All other components within normal limits  CBC  TROPONIN I  TROPONIN I  FIBRIN DERIVATIVES D-DIMER ([facility] ONLY)  TSH     ____________________________________________   EKG  ED ECG REPORT I, [place] Myalee Stengel, the attending physician, personally viewed and interpreted this ECG.   Date: 08/28/2015  EKG Time: 9:30 PM  Rate: 89  Rhythm: Normal sinus rhythm  Axis: Normal  Intervals: Normal  ST&T Change: None   ____________________________________________    RADIOLOGY DG Chest 2 View (Final result) Result time: 08/27/15 21:55:54   Final result by Rad Results In Interface (08/27/15 21:55:54)   Narrative:   CLINICAL DATA: Chest pain started a month ago.  EXAM: CHEST 2 VIEW  COMPARISON: 12/25/2014  FINDINGS: The heart size and mediastinal contours are within normal limits. Both lungs are clear. The visualized skeletal structures are unremarkable.  IMPRESSION: No active cardiopulmonary disease.   Electronically Signed By: Elige KoHetal Patel On: 08/27/2015 21:55         Procedures     INITIAL IMPRESSION / ASSESSMENT AND PLAN / ED COURSE  Pertinent labs & imaging results that were available during my care of the patient were reviewed by me and considered in my medical decision making (see chart for details).  Patient stated that she had a stress test performed by Dr.Khan which is negative within the last 2 years. No clear etiology for the patient's chest pain identified emergency Department troponin negative 2 EKG revealed no gross abnormalities d-dimer negative  ____________________________________________   FINAL CLINICAL IMPRESSION(S) / ED DIAGNOSES  Final diagnoses:  Chest pain, unspecified chest pain type      Darci Currentandolph N Leina Babe, MD 08/28/15 [phone]

## 2015-08-28 NOTE — Discharge Instructions (Signed)
Nonspecific Chest Pain  °Chest pain can be caused by many different conditions. There is always a chance that your pain could be related to something serious, such as a heart attack or a blood clot in your lungs. Chest pain can also be caused by conditions that are not life-threatening. If you have chest pain, it is very important to follow up with your health care provider. °CAUSES  °Chest pain can be caused by: °· Heartburn. °· Pneumonia or bronchitis. °· Anxiety or stress. °· Inflammation around your heart (pericarditis) or lung (pleuritis or pleurisy). °· A blood clot in your lung. °· A collapsed lung (pneumothorax). It can develop suddenly on its own (spontaneous pneumothorax) or from trauma to the chest. °· Shingles infection (varicella-zoster virus). °· Heart attack. °· Damage to the bones, muscles, and cartilage that make up your chest wall. This can include: °¨ Bruised bones due to injury. °¨ Strained muscles or cartilage due to frequent or repeated coughing or overwork. °¨ Fracture to one or more ribs. °¨ Sore cartilage due to inflammation (costochondritis). °RISK FACTORS  °Risk factors for chest pain may include: °· Activities that increase your risk for trauma or injury to your chest. °· Respiratory infections or conditions that cause frequent coughing. °· Medical conditions or overeating that can cause heartburn. °· Heart disease or family history of heart disease. °· Conditions or health behaviors that increase your risk of developing a blood clot. °· Having had chicken pox (varicella zoster). °SIGNS AND SYMPTOMS °Chest pain can feel like: °· Burning or tingling on the surface of your chest or deep in your chest. °· Crushing, pressure, aching, or squeezing pain. °· Dull or sharp pain that is worse when you move, cough, or take a deep breath. °· Pain that is also felt in your back, neck, shoulder, or arm, or pain that spreads to any of these areas. °Your chest pain may come and go, or it may stay  constant. °DIAGNOSIS °Lab tests or other studies may be needed to find the cause of your pain. Your health care provider may have you take a test called an ambulatory ECG (electrocardiogram). An ECG records your heartbeat patterns at the time the test is performed. You may also have other tests, such as: °· Transthoracic echocardiogram (TTE). During echocardiography, sound waves are used to create a picture of all of the heart structures and to look at how blood flows through your heart. °· Transesophageal echocardiogram (TEE). This is a more advanced imaging test that obtains images from inside your body. It allows your health care provider to see your heart in finer detail. °· Cardiac monitoring. This allows your health care provider to monitor your heart rate and rhythm in real time. °· Holter monitor. This is a portable device that records your heartbeat and can help to diagnose abnormal heartbeats. It allows your health care provider to track your heart activity for several days, if needed. °· Stress tests. These can be done through exercise or by taking medicine that makes your heart beat more quickly. °· Blood tests. °· Imaging tests. °TREATMENT  °Your treatment depends on what is causing your chest pain. Treatment may include: °· Medicines. These may include: °¨ Acid blockers for heartburn. °¨ Anti-inflammatory medicine. °¨ Pain medicine for inflammatory conditions. °¨ Antibiotic medicine, if an infection is present. °¨ Medicines to dissolve blood clots. °¨ Medicines to treat coronary artery disease. °· Supportive care for conditions that do not require medicines. This may include: °¨ Resting. °¨ Applying heat   or cold packs to injured areas. °¨ Limiting activities until pain decreases. °HOME CARE INSTRUCTIONS °· If you were prescribed an antibiotic medicine, finish it all even if you start to feel better. °· Avoid any activities that bring on chest pain. °· Do not use any tobacco products, including  cigarettes, chewing tobacco, or electronic cigarettes. If you need help quitting, ask your health care provider. °· Do not drink alcohol. °· Take medicines only as directed by your health care provider. °· Keep all follow-up visits as directed by your health care provider. This is important. This includes any further testing if your chest pain does not go away. °· If heartburn is the cause for your chest pain, you may be told to keep your head raised (elevated) while sleeping. This reduces the chance that acid will go from your stomach into your esophagus. °· Make lifestyle changes as directed by your health care provider. These may include: °¨ Getting regular exercise. Ask your health care provider to suggest some activities that are safe for you. °¨ Eating a heart-healthy diet. A registered dietitian can help you to learn healthy eating options. °¨ Maintaining a healthy weight. °¨ Managing diabetes, if necessary. °¨ Reducing stress. °SEEK MEDICAL CARE IF: °· Your chest pain does not go away after treatment. °· You have a rash with blisters on your chest. °· You have a fever. °SEEK IMMEDIATE MEDICAL CARE IF:  °· Your chest pain is worse. °· You have an increasing cough, or you cough up blood. °· You have severe abdominal pain. °· You have severe weakness. °· You faint. °· You have chills. °· You have sudden, unexplained chest discomfort. °· You have sudden, unexplained discomfort in your arms, back, neck, or jaw. °· You have shortness of breath at any time. °· You suddenly start to sweat, or your skin gets clammy. °· You feel nauseous or you vomit. °· You suddenly feel light-headed or dizzy. °· Your heart begins to beat quickly, or it feels like it is skipping beats. °These symptoms may represent a serious problem that is an emergency. Do not wait to see if the symptoms will go away. Get medical help right away. Call your local emergency services (911 in the [place] Do not drive yourself to the hospital. °  °This  information is not intended to replace advice given to you by your health care provider. Make sure you discuss any questions you have with your health care provider. °  °Document Released: 11/17/2004 Document Revised: 02/28/2014 Document Reviewed: 09/13/2013 °Elsevier Interactive Patient Education ©2016 [employer] ° °

## 2015-09-10 ENCOUNTER — Emergency Department
Admission: EM | Admit: 2015-09-10 | Discharge: 2015-09-10 | Disposition: A | Discharge status: Home / Self Care | Payer: Self-pay | Attending: Emergency Medicine | Admitting: Emergency Medicine

## 2015-09-10 ENCOUNTER — Encounter: Payer: Self-pay | Admitting: Emergency Medicine

## 2015-09-10 ENCOUNTER — Emergency Department: Payer: Self-pay

## 2015-09-10 DIAGNOSIS — R4182 Altered mental status, unspecified: Secondary | ICD-10-CM | POA: Insufficient documentation

## 2015-09-10 LAB — COMPREHENSIVE METABOLIC PANEL
ALT: 27 U/L (ref 14–54)
ANION GAP: 7 (ref 5–15)
AST: 22 U/L (ref 15–41)
Albumin: 4.4 g/dL (ref 3.5–5.0)
Alkaline Phosphatase: 70 U/L (ref 38–126)
BUN: 13 mg/dL (ref 6–20)
CALCIUM: 9 mg/dL (ref 8.9–10.3)
CHLORIDE: 107 mmol/L (ref 101–111)
CO2: 24 mmol/L (ref 22–32)
CREATININE: 0.89 mg/dL (ref 0.44–1.00)
Glucose, Bld: 96 mg/dL (ref 65–99)
Potassium: 3.5 mmol/L (ref 3.5–5.1)
Sodium: 138 mmol/L (ref 135–145)
Total Bilirubin: 1 mg/dL (ref 0.3–1.2)
Total Protein: 7.6 g/dL (ref 6.5–8.1)

## 2015-09-10 LAB — CBC WITH DIFFERENTIAL/PLATELET
Basophils Absolute: 0.1 10*3/uL (ref 0–0.1)
Basophils Relative: 1 %
EOS ABS: 0.1 10*3/uL (ref 0–0.7)
EOS PCT: 2 %
HCT: 40 % (ref 35.0–47.0)
Hemoglobin: 13.7 g/dL (ref 12.0–16.0)
LYMPHS ABS: 1.7 10*3/uL (ref 1.0–3.6)
Lymphocytes Relative: 27 %
MCH: 28.6 pg (ref 26.0–34.0)
MCHC: 34.3 g/dL (ref 32.0–36.0)
MCV: 83.5 fL (ref 80.0–100.0)
MONOS PCT: 10 %
Monocytes Absolute: 0.6 10*3/uL (ref 0.2–0.9)
Neutro Abs: 3.8 10*3/uL (ref 1.4–6.5)
Neutrophils Relative %: 60 %
PLATELETS: 283 10*3/uL (ref 150–440)
RBC: 4.79 MIL/uL (ref 3.80–5.20)
RDW: 13 % (ref 11.5–14.5)
WBC: 6.3 10*3/uL (ref 3.6–11.0)

## 2015-09-10 LAB — ACETAMINOPHEN LEVEL: Acetaminophen (Tylenol), Serum: <10 ug/mL — ABNORMAL LOW (ref 10–30)

## 2015-09-10 LAB — SALICYLATE LEVEL

## 2015-09-10 LAB — ETHANOL

## 2015-09-10 LAB — GLUCOSE, CAPILLARY: GLUCOSE-CAPILLARY: 84 mg/dL (ref 65–99)

## 2015-09-10 LAB — TROPONIN I: Troponin I: <0.03 ng/mL (ref <0.03)

## 2015-09-10 MED ORDER — ACETAMINOPHEN 325 MG PO TABS
650.0000 mg | ORAL_TABLET | Freq: Once | ORAL | Status: AC
  Administered 2015-09-10: 650 mg via ORAL
  Filled 2015-09-10: qty 2

## 2015-09-10 MED ORDER — SODIUM CHLORIDE 0.9 % IV BOLUS (SEPSIS)
1000.0000 mL | Freq: Once | INTRAVENOUS | Status: AC
  Administered 2015-09-10: 1000 mL via INTRAVENOUS

## 2015-09-10 NOTE — Discharge Instructions (Signed)
Confusion Confusion is the inability to think with your usual speed or clarity. Confusion may come on quickly or slowly over time. How quickly the confusion comes on depends on the cause. Confusion can be due to any number of causes. CAUSES   Concussion, head injury, or head trauma.  Seizures.  Stroke.  Fever.  Brain tumor.  Age related decreased brain function (dementia).  Heightened emotional states like rage or terror.  Mental illness in which the person loses the ability to determine what is real and what is not (hallucinations).  Infections such as a urinary tract infection (UTI).  Toxic effects from alcohol, drugs, or prescription medicines.  Dehydration and an imbalance of salts in the body (electrolytes).  Lack of sleep.  Low blood sugar (diabetes).  Low levels of oxygen from conditions such as chronic lung disorders.  Drug interactions or other medicine side effects.  Nutritional deficiencies, especially niacin, thiamine, vitamin C, or vitamin B.  Sudden drop in body temperature (hypothermia).  Change in routine, such as when traveling or hospitalized. SIGNS AND SYMPTOMS  People often describe their thinking as cloudy or unclear when they are confused. Confusion can also include feeling disoriented. That means you are unaware of where or who you are. You may also not know what the date or time is. If confused, you may also have difficulty paying attention, remembering, and making decisions. Some people also act aggressively when they are confused.  DIAGNOSIS  The medical evaluation of confusion may include:  Blood and urine tests.  X-rays.  Brain and nervous system tests.  Analyzing your brain waves (electroencephalogram or EEG).  Magnetic resonance imaging (MRI) of your head.  Computed tomography (CT) scan of your head.  Mental status tests in which your health care provider may ask many questions. Some of these questions may seem silly or strange,  but they are a very important test to help diagnose and treat confusion. TREATMENT  An admission to the hospital may not be needed, but a person with confusion should not be left alone. Stay with a family member or friend until the confusion clears. Avoid alcohol, pain relievers, or sedative drugs until you have fully recovered. Do not drive until directed by your health care provider. HOME CARE INSTRUCTIONS  What family and friends can do:  To find out if someone is confused, ask the person to state his or her name, age, and the date. If the person is unsure or answers incorrectly, he or she is confused.  Always introduce yourself, no matter how well the person knows you.  Often remind the person of his or her location.  Place a calendar and clock near the confused person.  Help the person with his or her medicines. You may want to use a pill box, an alarm as a reminder, or give the person each dose as prescribed.  Talk about current events and plans for the day.  Try to keep the environment calm, quiet, and peaceful.  Make sure the person keeps follow-up visits with his or her health care provider. PREVENTION  Ways to prevent confusion:  Avoid alcohol.  Eat a balanced diet.  Get enough sleep.  Take medicine only as directed by your health care provider.  Do not become isolated. Spend time with other people and make plans for your days.  Keep careful watch on your blood sugar levels if you are diabetic. SEEK IMMEDIATE MEDICAL CARE IF:   You develop severe headaches, repeated vomiting, seizures, blackouts, or   slurred speech.  There is increasing confusion, weakness, numbness, restlessness, or personality changes.  You develop a loss of balance, have marked dizziness, feel uncoordinated, or fall.  You have delusions, hallucinations, or develop severe anxiety.  Your family members think you need to be rechecked.   This information is not intended to replace advice given  to you by your health care provider. Make sure you discuss any questions you have with your health care provider.   Document Released: 03/17/2004 Document Revised: 02/28/2014 Document Reviewed: 03/15/2013 Elsevier Interactive Patient Education 2016 [employer]  

## 2015-09-10 NOTE — ED Provider Notes (Addendum)
[facility] Emergency Department Provider Note  ____________________________________________   I have reviewed the triage vital signs and the nursing notes.   HISTORY  Chief Complaint Loss of Consciousness and Altered Mental Status    HPI Shelby Crosby is a 35 y.o. female presents today with altered mental status. Patient is a largely healthy 35 year old woman. She does have a history of "psychosis" every time she takes medication for her paroxysmal atrial fibrillation. She has a history of anxiety as well. She presents today after being outside and a high heat index day with minimal food intake but adequate fluid intake according to family, she came in and sat down on the bathroom floor and stop being responsive. She wasn't unconscious. There was no seizure activity and she was awake but not talking. Patient did not bite her tongue or have incontinence of bowel or bladder. Upon arrival, patient is awake and staring she will withdraw to pain and seems to react alone noises and somewhat murmurs a little bit but does not speak clearly. Most of history therefore initially is from the family.Level 5 chart caveat; no further history available due to patient status.     History reviewed. No pertinent past medical history.  There are no active problems to display for this patient.   History reviewed. No pertinent past surgical history.  Current Outpatient Rx  Name  Route  Sig  Dispense  Refill  . azithromycin (ZITHROMAX Z-PAK) 250 MG tablet      500 mg po on day one, then 250 mg po q day on days 2-5 Patient not taking: Reported on 04/04/2014   6 tablet   0   . cefUROXime (CEFTIN) 500 MG tablet   Oral   Take 1 tablet (500 mg total) by mouth 2 (two) times daily.   20 tablet   0   . chlorpheniramine-HYDROcodone (TUSSIONEX PENNKINETIC ER) 10-8 MG/5ML SUER   Oral   Take 5 mLs by mouth every 12 (twelve) hours as needed for cough.   115 mL   0    . fexofenadine-pseudoephedrine (ALLEGRA-D ALLERGY & CONGESTION) 180-240 MG 24 hr tablet   Oral   Take 1 tablet by mouth daily.   30 tablet   0   . ondansetron (ZOFRAN ODT) 8 MG disintegrating tablet   Oral   Take 1 tablet (8 mg total) by mouth every 8 (eight) hours as needed for nausea or vomiting.   20 tablet   0   . ondansetron (ZOFRAN) 4 MG tablet   Oral   Take 1 tablet (4 mg total) by mouth every 6 (six) hours.   12 tablet   0   . traMADol (ULTRAM) 50 MG tablet   Oral   Take 1 tablet (50 mg total) by mouth every 6 (six) hours as needed.   15 tablet   0     Allergies Latex and Penicillins  No family history on file.  Social History Social History  Substance Use Topics  . Smoking status: Never Smoker   . Smokeless tobacco: None  . Alcohol Use: No    Review of Systems: Obtained after patient became more interactive Constitutional: No fever/chills Eyes: No visual changes. ENT: No sore throat. No stiff neck no neck pain Cardiovascular: Denies chest pain. Respiratory: Denies shortness of breath. Gastrointestinal:   no vomiting.  No diarrhea.  No constipation. Genitourinary: Negative for dysuria. Musculoskeletal: Negative lower extremity swelling Skin: Negative for rash. Neurological: Positive mild headache, focal weakness  or numbness. 10-point ROS otherwise negative.  ____________________________________________   PHYSICAL EXAM:  VITAL SIGNS: ED Triage Vitals  Enc Vitals Group     BP 09/10/15 2059 124/87 mmHg     Pulse Rate 09/10/15 1905 86     Resp 09/10/15 1905 11     Temp 09/10/15 1905 98 F (36.7 C)     Temp Source 09/10/15 1905 Axillary     SpO2 09/10/15 1905 100 %     Weight 09/10/15 1905 181 lb 11.2 oz (82.419 kg)     Height 09/10/15 1905  (1.702 m)     Head Cir --      Peak Flow --      Pain Score --      Pain Loc --      Pain Edu? --      Excl. in GC? --     Constitutional: AlertIn no acute medical distress, patient has a  diffusely flaccid exam however when I do provide painful stimuli for assessment, patient pulls back and localizes easily to pain. In addition, when I hold her arm over her head and drop it, what was a flaccid limb suddenly becomes a limb that does not fall directly onto her face. Eyes: Conjunctivae are normal. PERRL. EOMI. Head: Atraumatic. Nose: No congestion/rhinnorhea. Mouth/Throat: Mucous membranes are moist.  Oropharynx non-erythematous. Neck: No stridor.   Nontender with no meningismus Cardiovascular: Normal rate, regular rhythm. Grossly normal heart sounds.  Good peripheral circulation. Respiratory: Normal respiratory effort.  No retractions. Lungs CTAB. Abdominal: Soft and nontender. No distention. No guarding no rebound Back:  There is no focal tenderness or step off there is no midline tenderness there are no lesions noted. there is no CVA tenderness Musculoskeletal: No lower extremity tenderness. No joint effusions, no DVT signs strong distal pulses no edema Neurologic:  Normal speech and language. No gross focal neurologic deficits are appreciated.  Skin:  Skin is warm, dry and intact. No rash noted. Psychiatric: See above    LABS (all labs ordered are listed, but only abnormal results are displayed)  Labs Reviewed  ACETAMINOPHEN LEVEL - Abnormal; Notable for the following:    Acetaminophen (Tylenol), Serum <10 (*)    All other components within normal limits  COMPREHENSIVE METABOLIC PANEL  ETHANOL  SALICYLATE LEVEL  TROPONIN I  CBC WITH DIFFERENTIAL/PLATELET  GLUCOSE, CAPILLARY  URINALYSIS COMPLETEWITH MICROSCOPIC ([facility] ONLY)  URINE DRUG SCREEN, QUALITATIVE ([facility] ONLY)  CBG MONITORING, ED  POC URINE PREG, ED   ____________________________________________  EKG  I personally interpreted any EKGs ordered by me or triage Normal sinus rhythm at 96 bpm no acute ST elevation or acute ST depression normal axis unremarkable  EKG ____________________________________________  RADIOLOGY  I reviewed any imaging ordered by me or triage that were performed during my shift and, if possible, patient and/or family made aware of any abnormal findings. ____________________________________________   PROCEDURES  Procedure(s) performed: None  Critical Care performed: None  ____________________________________________   INITIAL IMPRESSION / ASSESSMENT AND PLAN / ED COURSE  Pertinent labs & imaging results that were available during my care of the patient were reviewed by me and considered in my medical decision making (see chart for details).  Patient with a very unusual exam, she did not appear to be truly catatonic. She was responsive and clearly protecting herself despite appearance of complete psychological withdrawal. She very rapidly woke up and return to her baseline, this is a very unusual presentation. We did call poison control they do  not think that there is anything in the wood finish that she was using the could've caused this. CT is negative there is is a very unlikely presentation for a CVA, the patient has no evidence of head bleed, she has no evidence of focal neurologic deficit at this time she is awake and converted and rapidly became so. Given some element of heat exhaustion but again it seemed to have some psychogenic elements as well. Nonetheless we have given her IV fluid her blood pressure and vital signs are reassuring, she is awake and alert, there is no evidence of significant toxidrome noted, blood pressure and blood work is reassuring. Patient has no SI and no HI and will reassess her closely.  ----------------------------------------- 10:08 PM on 09/10/2015 -----------------------------------------  Patient awake, eating a full meal, laughing and joking with her family, requesting discharge. I did offer and recommend addition to the hospital given her transient altered mental status of unknown  etiology, patient declines and laughs at me when I suggested. She states she feels fine and wants to go home. Family is with her. They agree. Patient has no neurologic deficits. She is awake and alert. She has no SI or HI, she has not thought about hurting herself, she has not taken an overdose.Cranial nerves II through XII are grossly intact 5 out of 5 strength bilateral upper and lower extremity. Finger to nose within normal limits heel to shin within normal limits, speech is normal with no word finding difficulty or dysarthria, reflexes symmetric, pupils are equally round and reactive to light, there is no pronator drift, sensation is normal, vision is intact to confrontation, gait is deferred, there is no nystagmus, normal neurologic exam. Feels because the patient's symptoms is very difficult for me that she is out as it was a very atypical presentation. We will refer her as an outpatient. No evidence of seizure activity but I did make her aware of the need to be careful with driving and soaking in the tub etc. ____________________________________________   FINAL CLINICAL IMPRESSION(S) / ED DIAGNOSES  Final diagnoses:  Altered mental status      This chart was dictated using voice recognition software.  Despite best efforts to proofread,  errors can occur which can change meaning.     Jeanmarie Plant, MD 09/10/15 2114  Jeanmarie Plant, MD 09/10/15 2209

## 2015-09-10 NOTE — ED Notes (Signed)
Pt arrived via ems from home where she had a syncopal episode after spraying stain outside. Per ems pt came inside and passed out in bathroom. EMS reported pt was unresponsive in bathroom when they arrived but all vital were WDL. Upon arrival pt very lethargic.

## 2015-09-10 NOTE — ED Provider Notes (Signed)
CSN: [id number]     Arrival date & time 09/10/15  1858 History   First MD Initiated Contact with Patient 09/10/15 1900     Chief Complaint  Patient presents with  . Loss of Consciousness  . Altered Mental Status     (Consider location/radiation/quality/duration/timing/severity/associated sxs/prior Treatment) HPI  History reviewed. No pertinent past medical history. History reviewed. No pertinent past surgical history. No family history on file. Social History  Substance Use Topics  . Smoking status: Never Smoker   . Smokeless tobacco: None  . Alcohol Use: No   OB History    No data available     Review of Systems    Allergies  Latex and Penicillins  Home Medications   Prior to Admission medications   Medication Sig Start Date End Date Taking? Authorizing Provider  azithromycin (ZITHROMAX Z-PAK) 250 MG tablet 500 mg po on day one, then 250 mg po q day on days 2-5 Patient not taking: Reported on 04/04/2014 02/15/11   Niel Hummer, MD  cefUROXime (CEFTIN) 500 MG tablet Take 1 tablet (500 mg total) by mouth 2 (two) times daily. 12/25/14   Hassan Rowan, MD  chlorpheniramine-HYDROcodone [facility]) 10-8 MG/5ML SUER Take 5 mLs by mouth every 12 (twelve) hours as needed for cough. 12/25/14   Hassan Rowan, MD  fexofenadine-pseudoephedrine (ALLEGRA-D ALLERGY & CONGESTION) 180-240 MG 24 hr tablet Take 1 tablet by mouth daily. 12/25/14   Hassan Rowan, MD  ondansetron (ZOFRAN ODT) 8 MG disintegrating tablet Take 1 tablet (8 mg total) by mouth every 8 (eight) hours as needed for nausea or vomiting. 12/25/14   Hassan Rowan, MD  ondansetron (ZOFRAN) 4 MG tablet Take 1 tablet (4 mg total) by mouth every 6 (six) hours. 04/04/14   Roxy Horseman, PA-C  traMADol (ULTRAM) 50 MG tablet Take 1 tablet (50 mg total) by mouth every 6 (six) hours as needed. 04/04/14   Roxy Horseman, PA-C   BP 124/87 mmHg  Pulse 73  Temp(Src) 98 F (36.7 C) (Axillary)  Resp 18  Ht  (1.702 m)  Wt  181 lb 11.2 oz (82.419 kg)  BMI 28.45 kg/m2  SpO2 98% Physical Exam  ED Course  Procedures (including critical care time) Labs Review Labs Reviewed  ACETAMINOPHEN LEVEL - Abnormal; Notable for the following:    Acetaminophen (Tylenol), Serum <10 (*)    All other components within normal limits  COMPREHENSIVE METABOLIC PANEL  ETHANOL  SALICYLATE LEVEL  TROPONIN I  CBC WITH DIFFERENTIAL/PLATELET  GLUCOSE, CAPILLARY  URINALYSIS COMPLETEWITH MICROSCOPIC ([facility] ONLY)  URINE DRUG SCREEN, QUALITATIVE ([facility] ONLY)  CBG MONITORING, ED  POC URINE PREG, ED    Imaging Review Ct Head Wo Contrast  09/10/2015  CLINICAL DATA:  Syncopal episode tonight. EXAM: CT HEAD WITHOUT CONTRAST TECHNIQUE: Contiguous axial images were obtained from the base of the skull through the vertex without intravenous contrast. COMPARISON:  None. FINDINGS: The ventricles are normal in size and configuration. No extra-axial fluid collections are identified. The gray-white differentiation is normal. No CT findings for acute intracranial process such as hemorrhage or infarction. No mass lesions. The brainstem and cerebellum are grossly normal. The bony structures are intact. The paranasal sinuses and mastoid air cells are clear. The globes are intact. IMPRESSION: Normal head CT Electronically Signed   By: Rudie Meyer M.D.   On: 09/10/2015 19:42   Dg Chest Port 1 View  09/10/2015  CLINICAL DATA:  Syncopal episode.  Altered mental status. EXAM: PORTABLE CHEST 1 VIEW COMPARISON:  08/27/2015 FINDINGS: The heart size and mediastinal contours are within normal limits. Low lung volumes noted, however there is no evidence of pulmonary consolidation, pleural effusion, or pneumothorax. IMPRESSION: Low lung volumes.  No active disease. Electronically Signed   By: Myles RosenthalJohn  Stahl M.D.   On: 09/10/2015 19:49   I have personally reviewed and evaluated these images and lab results as part of my medical decision-making.   EKG  Interpretation None      MDM   Final diagnoses:  Altered mental status        Jeanmarie PlantJames A Ziasia Lenoir, MD 09/10/15 2213

## 2015-09-10 NOTE — ED Notes (Signed)
MD at bedside. 

## 2015-10-02 DIAGNOSIS — J383 Other diseases of vocal cords: Secondary | ICD-10-CM | POA: Insufficient documentation

## 2016-03-05 ENCOUNTER — Emergency Department: Payer: Self-pay

## 2016-03-05 ENCOUNTER — Encounter: Payer: Self-pay | Admitting: Emergency Medicine

## 2016-03-05 DIAGNOSIS — Z79899 Other long term (current) drug therapy: Secondary | ICD-10-CM | POA: Insufficient documentation

## 2016-03-05 DIAGNOSIS — R079 Chest pain, unspecified: Secondary | ICD-10-CM | POA: Insufficient documentation

## 2016-03-05 LAB — BASIC METABOLIC PANEL
ANION GAP: 4 — AB (ref 5–15)
BUN: 15 mg/dL (ref 6–20)
CO2: 27 mmol/L (ref 22–32)
Calcium: 9.2 mg/dL (ref 8.9–10.3)
Chloride: 108 mmol/L (ref 101–111)
Creatinine, Ser: 0.85 mg/dL (ref 0.44–1.00)
GLUCOSE: 117 mg/dL — AB (ref 65–99)
POTASSIUM: 4.1 mmol/L (ref 3.5–5.1)
Sodium: 139 mmol/L (ref 135–145)

## 2016-03-05 LAB — CBC WITH DIFFERENTIAL/PLATELET
BASOS PCT: 1 %
Basophils Absolute: 0.1 10*3/uL (ref 0–0.1)
EOS ABS: 0.1 10*3/uL (ref 0–0.7)
Eosinophils Relative: 1 %
HEMATOCRIT: 39.4 % (ref 35.0–47.0)
HEMOGLOBIN: 13.5 g/dL (ref 12.0–16.0)
LYMPHS ABS: 2.2 10*3/uL (ref 1.0–3.6)
Lymphocytes Relative: 31 %
MCH: 29 pg (ref 26.0–34.0)
MCHC: 34.4 g/dL (ref 32.0–36.0)
MCV: 84.5 fL (ref 80.0–100.0)
MONOS PCT: 9 %
Monocytes Absolute: 0.6 10*3/uL (ref 0.2–0.9)
NEUTROS ABS: 4 10*3/uL (ref 1.4–6.5)
NEUTROS PCT: 58 %
Platelets: 323 10*3/uL (ref 150–440)
RBC: 4.66 MIL/uL (ref 3.80–5.20)
RDW: 12.7 % (ref 11.5–14.5)
WBC: 7.1 10*3/uL (ref 3.6–11.0)

## 2016-03-05 LAB — TROPONIN I: Troponin I: <0.03 ng/mL (ref <0.03)

## 2016-03-05 NOTE — ED Triage Notes (Signed)
Pt states that she has been experiencing N/V x1 week that comes and goes. Pt states that she has been experiencing a burning sensation in her chest since around 0200 03/05/2016. Pt also reports that she has a hx of atrial fibrillation and a mitral valve prolapse in 2006. Pt states that she is not on medication for her afib due to reactions to medications in the past. Pt is ambulatory to triage and states that she has been having SOB and diaphoresis.

## 2016-03-05 NOTE — ED Triage Notes (Signed)
Patient with complaint of left side chest pain radiating to left jaw that started yesterday. Patient states that she has had shortness of breath and nausea.  Patient states that she has also been feeling tired over the past 4 days. Patient states that she has mitral valve prolapse.

## 2016-03-06 ENCOUNTER — Emergency Department: Payer: Self-pay

## 2016-03-06 ENCOUNTER — Emergency Department
Admission: EM | Admit: 2016-03-06 | Discharge: 2016-03-06 | Discharge status: Against Medical Advice | Payer: Self-pay | Attending: Emergency Medicine | Admitting: Emergency Medicine

## 2016-03-06 DIAGNOSIS — R079 Chest pain, unspecified: Secondary | ICD-10-CM

## 2016-03-06 HISTORY — DX: Unspecified atrial fibrillation: I48.91

## 2016-03-06 LAB — TROPONIN I

## 2016-03-06 LAB — TSH: TSH: 1.012 u[IU]/mL (ref 0.350–4.500)

## 2016-03-06 MED ORDER — TRAMADOL HCL 50 MG PO TABS
50.0000 mg | ORAL_TABLET | Freq: Once | ORAL | Status: AC
  Administered 2016-03-06: 50 mg via ORAL
  Filled 2016-03-06: qty 1

## 2016-03-06 MED ORDER — GI COCKTAIL ~~LOC~~
30.0000 mL | Freq: Once | ORAL | Status: AC
  Administered 2016-03-06: 30 mL via ORAL
  Filled 2016-03-06: qty 30

## 2016-03-06 MED ORDER — SUCRALFATE 1 G PO TABS
1.0000 g | ORAL_TABLET | Freq: Once | ORAL | Status: AC
  Administered 2016-03-06: 1 g via ORAL
  Filled 2016-03-06: qty 1

## 2016-03-06 NOTE — ED Provider Notes (Signed)
[facility] Emergency Department Provider Note   ____________________________________________   First MD Initiated Contact with Patient 03/06/16 0202     (approximate)  I have reviewed the triage vital signs and the nursing notes.   HISTORY  Chief Complaint Chest Pain    HPI Shelby Crosby is a 36 y.o. female who comes into the hospital today with burning in her chest and neck pain. She reports it is been coming and going. She is also had some nausea and vomiting and feeling fatigued. The patient reports a week ago she started having some fatigue with the burning in her chest started around this afternoon. The patient reports is been coming and going. She states that she checked her heart rate this afternoon and it was 120. She reports she does have a history of mitral valve prolapse issues used to having some different pains in her chest. She reports though that the pain went into her neck and into her chin and it concerned her. The patient is been taking Prilosec which has not helped. She reports that she took a Flexeril at 4 AM and it helped a little but around 9 PM the pain was constant O2 sat is coming to the hospital to get checked out. The patient reports it started shooting down her arm and she did have some leg pain today. She rates her pain a 4-5 out of 10 in intensity currently. The patient reports that this does not feel like her typical reflux which is concerning. She is here for evaluation.   Past Medical History:  Diagnosis Date  . Atrial fibrillation (HCC)     There are no active problems to display for this patient.   Past Surgical History:  Procedure Laterality Date  . ABDOMINAL HYSTERECTOMY     partial  . APPENDECTOMY    . MITRAL VALVE SURGERY      Prior to Admission medications   Medication Sig Start Date End Date Taking? Authorizing Provider  azithromycin (ZITHROMAX Z-PAK) 250 MG tablet 500 mg po on day one, then 250 mg po q  day on days 2-5 Patient not taking: Reported on 04/04/2014 02/15/11   Niel Hummer, MD  cefUROXime (CEFTIN) 500 MG tablet Take 1 tablet (500 mg total) by mouth 2 (two) times daily. 12/25/14   Hassan Rowan, MD  chlorpheniramine-HYDROcodone [facility]) 10-8 MG/5ML SUER Take 5 mLs by mouth every 12 (twelve) hours as needed for cough. 12/25/14   Hassan Rowan, MD  fexofenadine-pseudoephedrine (ALLEGRA-D ALLERGY & CONGESTION) 180-240 MG 24 hr tablet Take 1 tablet by mouth daily. 12/25/14   Hassan Rowan, MD  ondansetron (ZOFRAN ODT) 8 MG disintegrating tablet Take 1 tablet (8 mg total) by mouth every 8 (eight) hours as needed for nausea or vomiting. 12/25/14   Hassan Rowan, MD  ondansetron (ZOFRAN) 4 MG tablet Take 1 tablet (4 mg total) by mouth every 6 (six) hours. 04/04/14   Roxy Horseman, PA-C  traMADol (ULTRAM) 50 MG tablet Take 1 tablet (50 mg total) by mouth every 6 (six) hours as needed. 04/04/14   Roxy Horseman, PA-C    Allergies Latex and Penicillins  No family history on file.  Social History Social History  Substance Use Topics  . Smoking status: Never Smoker  . Smokeless tobacco: Never Used  . Alcohol use No    Review of Systems Constitutional: No fever/chills Eyes: No visual changes. ENT: No sore throat. Cardiovascular:  chest pain. Respiratory: shortness of breath. Gastrointestinal: Nausea and  vomiting with No abdominal pain.   No diarrhea.  No constipation. Genitourinary: Negative for dysuria. Musculoskeletal: Negative for back pain. Skin: Negative for rash. Neurological: Negative for headaches, focal weakness or numbness.  10-point ROS otherwise negative.  ____________________________________________   PHYSICAL EXAM:  VITAL SIGNS: ED Triage Vitals  Enc Vitals Group     BP 03/05/16 2302 (!) 143/80     Pulse Rate 03/05/16 2302 91     Resp 03/05/16 2302 18     Temp 03/05/16 2302 98.1 F (36.7 C)     Temp Source 03/05/16 2302 Oral     SpO2 03/05/16 2302  100 %     Weight 03/05/16 2301 172 lb (78 kg)     Height 03/05/16 2301 5\' 4"  (1.626 m)     Head Circumference --      Peak Flow --      Pain Score 03/05/16 2301 5     Pain Loc --      Pain Edu? --      Excl. in GC? --     Constitutional: Alert and oriented. Well appearing and in Mild distress. Eyes: Conjunctivae are normal. PERRL. EOMI. Head: Atraumatic. Nose: No congestion/rhinnorhea. Mouth/Throat: Mucous membranes are moist.  Oropharynx non-erythematous. Cardiovascular: Normal rate, regular rhythm. Grossly normal heart sounds.  Good peripheral circulation. Respiratory: Normal respiratory effort.  No retractions. Lungs CTAB. Gastrointestinal: Soft and nontender. No distention. Positive bowel sounds Musculoskeletal: No lower extremity tenderness nor edema.   Neurologic:  Normal speech and language.  Skin:  Skin is warm, dry and intact.  Psychiatric: Mood and affect are normal.   ____________________________________________   LABS (all labs ordered are listed, but only abnormal results are displayed)  Labs Reviewed  BASIC METABOLIC PANEL - Abnormal; Notable for the following:       Result Value   Glucose, Bld 117 (*)    Anion gap 4 (*)    All other components within normal limits  TROPONIN I  CBC WITH DIFFERENTIAL/PLATELET  TSH  TROPONIN I   ____________________________________________  EKG  ED ECG REPORT I, Rebecka Apley, the attending physician, personally viewed and interpreted this ECG.   Date: 03/05/2016  EKG Time: 2256  Rate: 100  Rhythm: normal sinus rhythm  Axis: normal  Intervals:none  ST&T Change: none  ____________________________________________  RADIOLOGY  CXR ____________________________________________   PROCEDURES  Procedure(s) performed: None  Procedures  Critical Care performed: No  ____________________________________________   INITIAL IMPRESSION / ASSESSMENT AND PLAN / ED COURSE  Pertinent labs & imaging results that  were available during my care of the patient were reviewed by me and considered in my medical decision making (see chart for details).  This is a 36 year old female who comes into the hospital today with some burning chest pain. The patient does have a history of reflux but reports that it does feel different. I will give the patient a GI cocktail and some Carafate as well as some tramadol for her pain. Her initial blood work is unremarkable I will add on a TSH as well as repeat troponin. I will then reassess the patient.  Clinical Course as of Mar 06 354  Sun Mar 06, 2016  0213 Clear lungs. DG Chest 2 View [AW]    Clinical Course User Index [AW] Rebecka Apley, MD   The patient's TSH is unremarkable and her repeat troponin is negative. I did receive the information but when I went to check back in on the patient she was  not in the room. It appears that the patient eloped without her results. The patient's results are unremarkable and she should follow-up.  ____________________________________________   FINAL CLINICAL IMPRESSION(S) / ED DIAGNOSES  Final diagnoses:  Chest pain, unspecified type      NEW MEDICATIONS STARTED DURING THIS VISIT:  Discharge Medication List as of 03/06/2016  3:46 AM       Note:  This document was prepared using Dragon voice recognition software and may include unintentional dictation errors.    Rebecka ApleyAllison P Cleatus Gabriel, MD 03/06/16 [phone]

## 2016-03-06 NOTE — ED Notes (Addendum)
Patient with questions regarding interventions ordered by MD; explained by this RN. Patient seemed to get upset. RN advised patient that MD trying to rule out GI cause of her symptoms, rechecking her cardiac enzymes, and also checking her thyroid. Patient states, "GI cocktail? The magic fix all drink. That is not going to work". RN advised patient that again, MD was ruling out causing of the "burning" sensation in her chest, neck, and throat given that her initial enzymes were negative and her ECG was normal. Patient rolled her eyes and stuck her arm for the lab draw no further conversation from patient while RN was in the room. Rn reiterated POC; patient did not respond. MD made aware.

## 2016-03-06 NOTE — ED Notes (Signed)
Patient had been told she was up for discharge by MD. Patient and MD discussed discharge plans. RN room to review discharge instructions/asses pain score. Patient left prior to receiving discharge instructions.

## 2016-12-09 ENCOUNTER — Emergency Department: Payer: Self-pay

## 2016-12-09 ENCOUNTER — Emergency Department
Admission: EM | Admit: 2016-12-09 | Discharge: 2016-12-09 | Disposition: A | Discharge status: Home / Self Care | Payer: Self-pay | Attending: Emergency Medicine | Admitting: Emergency Medicine

## 2016-12-09 DIAGNOSIS — M79604 Pain in right leg: Secondary | ICD-10-CM | POA: Insufficient documentation

## 2016-12-09 DIAGNOSIS — Z91040 Latex allergy status: Secondary | ICD-10-CM | POA: Insufficient documentation

## 2016-12-09 DIAGNOSIS — Z79899 Other long term (current) drug therapy: Secondary | ICD-10-CM | POA: Insufficient documentation

## 2016-12-09 HISTORY — DX: Supraventricular tachycardia: I47.1

## 2016-12-09 HISTORY — DX: Supraventricular tachycardia, unspecified: I47.10

## 2016-12-09 HISTORY — DX: Nonrheumatic mitral (valve) prolapse: I34.1

## 2016-12-09 LAB — COMPREHENSIVE METABOLIC PANEL
ALT: 19 U/L (ref 14–54)
AST: 19 U/L (ref 15–41)
Albumin: 4.5 g/dL (ref 3.5–5.0)
Alkaline Phosphatase: 74 U/L (ref 38–126)
Anion gap: 8 (ref 5–15)
BUN: 17 mg/dL (ref 6–20)
CHLORIDE: 103 mmol/L (ref 101–111)
CO2: 25 mmol/L (ref 22–32)
CREATININE: 1 mg/dL (ref 0.44–1.00)
Calcium: 9.4 mg/dL (ref 8.9–10.3)
GFR calc non Af Amer: >60 mL/min (ref >60)
Glucose, Bld: 100 mg/dL — ABNORMAL HIGH (ref 65–99)
POTASSIUM: 3.8 mmol/L (ref 3.5–5.1)
SODIUM: 136 mmol/L (ref 135–145)
Total Bilirubin: 1 mg/dL (ref 0.3–1.2)
Total Protein: 8.1 g/dL (ref 6.5–8.1)

## 2016-12-09 LAB — CBC
HCT: 39.9 % (ref 35.0–47.0)
Hemoglobin: 13.8 g/dL (ref 12.0–16.0)
MCH: 29 pg (ref 26.0–34.0)
MCHC: 34.5 g/dL (ref 32.0–36.0)
MCV: 84 fL (ref 80.0–100.0)
PLATELETS: 358 10*3/uL (ref 150–440)
RBC: 4.75 MIL/uL (ref 3.80–5.20)
RDW: 12.5 % (ref 11.5–14.5)
WBC: 9.8 10*3/uL (ref 3.6–11.0)

## 2016-12-09 MED ORDER — MORPHINE SULFATE (PF) 2 MG/ML IV SOLN
2.0000 mg | Freq: Once | INTRAVENOUS | Status: AC
Start: 2016-12-09 — End: 2016-12-09
  Administered 2016-12-09: 2 mg via INTRAVENOUS

## 2016-12-09 MED ORDER — MORPHINE SULFATE (PF) 2 MG/ML IV SOLN
INTRAVENOUS | Status: AC
  Filled 2016-12-09: qty 1

## 2016-12-09 MED ORDER — MORPHINE SULFATE (PF) 4 MG/ML IV SOLN
INTRAVENOUS | Status: AC
  Filled 2016-12-09: qty 1

## 2016-12-09 MED ORDER — ONDANSETRON HCL 4 MG/2ML IJ SOLN
4.0000 mg | Freq: Once | INTRAMUSCULAR | Status: AC
  Administered 2016-12-09: 4 mg via INTRAVENOUS

## 2016-12-09 MED ORDER — MORPHINE SULFATE (PF) 4 MG/ML IV SOLN
4.0000 mg | Freq: Once | INTRAVENOUS | Status: AC
  Administered 2016-12-09: 4 mg via INTRAVENOUS

## 2016-12-09 MED ORDER — ONDANSETRON HCL 4 MG/2ML IJ SOLN
INTRAMUSCULAR | Status: AC
  Administered 2016-12-09: 4 mg via INTRAVENOUS
  Filled 2016-12-09: qty 2

## 2016-12-09 MED ORDER — IOPAMIDOL (ISOVUE-370) INJECTION 76%
125.0000 mL | Freq: Once | INTRAVENOUS | Status: AC | PRN
  Administered 2016-12-09: 125 mL via INTRAVENOUS

## 2016-12-09 MED ORDER — ONDANSETRON HCL 4 MG/2ML IJ SOLN
INTRAMUSCULAR | Status: AC
  Filled 2016-12-09: qty 2

## 2016-12-09 MED ORDER — OXYCODONE-ACETAMINOPHEN 5-325 MG PO TABS: 1.0000 | ORAL_TABLET | Freq: Once | ORAL | Status: DC

## 2016-12-09 MED ORDER — OXYCODONE-ACETAMINOPHEN 5-325 MG PO TABS
ORAL_TABLET | ORAL | Status: AC
  Filled 2016-12-09: qty 1

## 2016-12-09 NOTE — ED Notes (Signed)

## 2016-12-09 NOTE — ED Provider Notes (Signed)
[facility] Emergency Department Provider Note    First MD Initiated Contact with Patient 12/09/16 0402     (approximate)  I have reviewed the triage vital signs and the nursing notes.   HISTORY  Chief Complaint Leg Pain (right)    HPI Shelby Crosby is a 36 y.o. female presents with acute onset of nontraumatic right leg pain which began at 11:00 PM tonight.patient states current pain score is 10 out of 10. Patient denies any dyspnea no chest pain. Patient denies any history of DVT or PE.   Past Medical History:  Diagnosis Date  . Atrial fibrillation (HCC)   . Mitral valve prolapse   . SVT (supraventricular tachycardia) (HCC)     There are no active problems to display for this patient.   Past Surgical History:  Procedure Laterality Date  . ABDOMINAL HYSTERECTOMY     partial  . APPENDECTOMY    . MITRAL VALVE SURGERY      Prior to Admission medications   Medication Sig Start Date End Date Taking? Authorizing Provider  azithromycin (ZITHROMAX Z-PAK) 250 MG tablet 500 mg po on day one, then 250 mg po q day on days 2-5 Patient not taking: Reported on 04/04/2014 02/15/11   Niel Hummer, MD  cefUROXime (CEFTIN) 500 MG tablet Take 1 tablet (500 mg total) by mouth 2 (two) times daily. 12/25/14   Hassan Rowan, MD  chlorpheniramine-HYDROcodone [facility] PENNKINETIC ER) 10-8 MG/5ML SUER Take 5 mLs by mouth every 12 (twelve) hours as needed for cough. 12/25/14   Hassan Rowan, MD  fexofenadine-pseudoephedrine (ALLEGRA-D ALLERGY & CONGESTION) 180-240 MG 24 hr tablet Take 1 tablet by mouth daily. 12/25/14   Hassan Rowan, MD  ondansetron (ZOFRAN ODT) 8 MG disintegrating tablet Take 1 tablet (8 mg total) by mouth every 8 (eight) hours as needed for nausea or vomiting. 12/25/14   Hassan Rowan, MD  ondansetron (ZOFRAN) 4 MG tablet Take 1 tablet (4 mg total) by mouth every 6 (six) hours. 04/04/14   Roxy Horseman, PA-C  traMADol (ULTRAM) 50 MG tablet Take 1  tablet (50 mg total) by mouth every 6 (six) hours as needed. 04/04/14   Roxy Horseman, PA-C    Allergies Latex and Penicillins  No family history on file.  Social History Social History  Substance Use Topics  . Smoking status: Never Smoker  . Smokeless tobacco: Never Used  . Alcohol use No    Review of Systems Constitutional: No fever/chills Eyes: No visual changes. ENT: No sore throat. Cardiovascular: Denies chest pain. Respiratory: Denies shortness of breath. Gastrointestinal: No abdominal pain.  No nausea, no vomiting.  No diarrhea.  No constipation. Genitourinary: Negative for dysuria. Musculoskeletal: Negative for neck pain.  Negative for back pain. Positive for right leg pain Integumentary: Negative for rash. Neurological: Negative for headaches, focal weakness or numbness.   ____________________________________________   PHYSICAL EXAM:  VITAL SIGNS: ED Triage Vitals  Enc Vitals Group     BP 12/09/16 0310 (!) 149/108     Pulse Rate 12/09/16 0310 89     Resp 12/09/16 0310 (!) 22     Temp 12/09/16 0310 97.7 F (36.5 C)     Temp Source 12/09/16 0310 Oral     SpO2 12/09/16 0310 100 %     Weight 12/09/16 0312 78.5 kg (173 lb)     Height 12/09/16 0312 1.638 m (5' 4.5")     Head Circumference --      Peak Flow --  Pain Score 12/09/16 0310 8     Pain Loc --      Pain Edu? --      Excl. in GC? --     Constitutional: Alert and oriented. tearful Eyes: Conjunctivae are normal.  Head: Atraumatic. Mouth/Throat: Mucous membranes are moist.  Oropharynx non-erythematous. Neck: No stridor. Cardiovascular: Normal rate, regular rhythm. Good peripheral circulation. Grossly normal heart sounds. Respiratory: Normal respiratory effort.  No retractions. Lungs CTAB. Gastrointestinal: Soft and nontender. No distention.  Musculoskeletal: No lower extremity tenderness nor edema. No gross deformities of extremities.Palpable PT/DP equal bilateral Neurologic:  Normal speech  and language. No gross focal neurologic deficits are appreciated.  Skin:  Skin is warm, dry and intact. No rash noted. Psychiatric: Mood and affect are normal. Speech and behavior are normal.  ____________________________________________   LABS (all labs ordered are listed, but only abnormal results are displayed)  Labs Reviewed  COMPREHENSIVE METABOLIC PANEL - Abnormal; Notable for the following:       Result Value   Glucose, Bld 100 (*)    All other components within normal limits  CBC    RADIOLOGY I, Pioneer N Lexx [place], personally viewed and evaluated these images (plain radiographs) as part of my medical decision making, as well as reviewing the written report by the radiologist.  CLINICAL DATA:  Sudden onset of right leg pain posterior above the knee and shooting to the groin.  EXAM: CT ANGIOGRAPHY OF ABDOMINAL AORTA WITH ILIOFEMORAL RUNOFF  TECHNIQUE: Multidetector CT imaging of the abdomen, pelvis and lower extremities was performed using the standard protocol during bolus administration of intravenous contrast. Multiplanar CT image reconstructions and MIPs were obtained to evaluate the vascular anatomy.  CONTRAST:  125 mL Isovue 370  COMPARISON:  CT renal stone protocol abdomen and pelvis 04/04/2014  FINDINGS: VASCULAR  Aorta: Normal caliber aorta without aneurysm, dissection, vasculitis or significant stenosis.  Celiac: Patent without evidence of aneurysm, dissection, vasculitis or significant stenosis.  SMA: Patent without evidence of aneurysm, dissection, vasculitis or significant stenosis.  Renals: Both renal arteries are patent without evidence of aneurysm, dissection, vasculitis, fibromuscular dysplasia or significant stenosis.  IMA: Patent without evidence of aneurysm, dissection, vasculitis or significant stenosis.  RIGHT Lower Extremity  Inflow: Common, internal and external iliac arteries are patent without evidence of aneurysm,  dissection, vasculitis or significant stenosis.  Outflow: Common, superficial and profunda femoral arteries and the popliteal artery are patent without evidence of aneurysm, dissection, vasculitis or significant stenosis.  Runoff: Patent three vessel runoff to the ankle.  LEFT Lower Extremity  Inflow: Common, internal and external iliac arteries are patent without evidence of aneurysm, dissection, vasculitis or significant stenosis.  Outflow: Common, superficial and profunda femoral arteries and the popliteal artery are patent without evidence of aneurysm, dissection, vasculitis or significant stenosis.  Runoff: Patent three vessel runoff to the ankle.  Veins: No obvious venous abnormality within the limitations of this arterial phase study.  Review of the MIP images confirms the above findings.  NON-VASCULAR  Lower chest: Atelectasis in the lung bases.  Hepatobiliary: Mild diffuse fatty infiltration of the liver. No focal lesions. Gallbladder and bile ducts are unremarkable.  Pancreas: Unremarkable. No pancreatic ductal dilatation or surrounding inflammatory changes.  Spleen: Normal in size without focal abnormality.  Adrenals/Urinary Tract: Adrenal glands are unremarkable. Kidneys are normal, without renal calculi, focal lesion, or hydronephrosis. Bladder is unremarkable.  Stomach/Bowel: Stomach is within normal limits. Appendix appears surgically absent. No evidence of bowel wall thickening, distention, or inflammatory changes.  Lymphatic: No significant lymphadenopathy.  Reproductive: Status post hysterectomy. No adnexal masses.  Other: No abdominal wall hernia or abnormality. No abdominopelvic ascites.  Musculoskeletal: No acute or significant osseous findings.  IMPRESSION: VASCULAR  No evidence of significant aneurysm, stenosis, atherosclerotic changes, or embolus. Aorto iliac and runoff vessels are patent.  NON-VASCULAR  No  acute process demonstrated in the abdomen or pelvis.   Electronically Signed   By: Burman Nieves M.D.   On: 12/09/2016 06:52  Procedures   ____________________________________________   INITIAL IMPRESSION / ASSESSMENT AND PLAN / ED COURSE  As part of my medical decision making, I reviewed the following data within the electronic MEDICAL RECORD NUMBER 36 year old female presenting with above stated history and physical exam of right leg pain which she describes as 10 out of 10. Laboratory data unremarkable notably potassium and calcium both normal. Ultrasound of the leg revealed no evidence of DVT or PE. Patient given IV morphine on arrival to the emergency department by states that pain is persistent and still 10 out of 10 and a such she received an additional dose of 4 mg of morphine. Palpable DP and PT pulses bilaterally were equal however given patient's marked discomfort CT angiogram aortobifemoral runoff was performed which is also negative. Patient advised to follow-up with primary care provider for further outpatient evaluation and management.     ____________________________________________  FINAL CLINICAL IMPRESSION(S) / ED DIAGNOSES  Final diagnoses:  Right leg pain     MEDICATIONS GIVEN DURING THIS VISIT:  Medications  oxyCODONE-acetaminophen (PERCOCET/ROXICET) 5-325 MG per tablet 1 tablet (1 tablet Oral Refused 12/09/16 0542)  morphine 2 MG/ML injection 2 mg (2 mg Intravenous Given 12/09/16 0417)  ondansetron (ZOFRAN) injection 4 mg (4 mg Intravenous Given 12/09/16 0417)  morphine 4 MG/ML injection 4 mg (4 mg Intravenous Given 12/09/16 0541)  iopamidol (ISOVUE-370) 76 % injection 125 mL (125 mLs Intravenous Contrast Given 12/09/16 0554)  ondansetron (ZOFRAN) injection 4 mg (4 mg Intravenous Given 12/09/16 0548)     NEW OUTPATIENT MEDICATIONS STARTED DURING THIS VISIT:  New Prescriptions   No medications on file    Modified Medications   No medications on  file    Discontinued Medications   No medications on file     Note:  This document was prepared using Dragon voice recognition software and may include unintentional dictation errors.    Darci Current, MD 12/09/16 [phone]

## 2016-12-09 NOTE — ED Notes (Signed)
Pt states right leg pain, posterior above knee, shooting to groin. Constant intense pain. Pt states pain shooting to right lower leg. Pt states was sitting at the table when sudden onset 2300 last pm. Pt denies injury. Cardiac hx, no hx clots.

## 2016-12-09 NOTE — ED Notes (Signed)
Patient transported to Ultrasound 

## 2016-12-09 NOTE — ED Triage Notes (Signed)
Patient c/o right leg cramps beginning at 2300 10/18. Patient denies injury and hx of the same.

## 2017-12-11 IMAGING — CT CT ANGIO AOBIFEM WO/W CM
2 of 10 series · 11 of 46 positions shown, 15 images · IV contrast (APPLIED)
Comparison: CT renal stone protocol abdomen and pelvis 04/04/2014

CLINICAL DATA: Sudden onset of right leg pain posterior above the
knee and shooting to the groin.

EXAM:
CT ANGIOGRAPHY OF ABDOMINAL AORTA WITH ILIOFEMORAL RUNOFF
TECHNIQUE: Multidetector CT imaging of the abdomen, pelvis and lower
extremities was performed using the standard protocol during bolus
administration of intravenous contrast. Multiplanar CT image
reconstructions and MIPs were obtained to evaluate the vascular
anatomy.
CONTRAST:  125 mL Isovue 370

[Series 5: axial arterial upper · axial · arterial · 0.84mm/px · z∈[+606,+1278]mm · 10 of 273 slices shown, 13 images]
[im 33/273  soft-tissue]
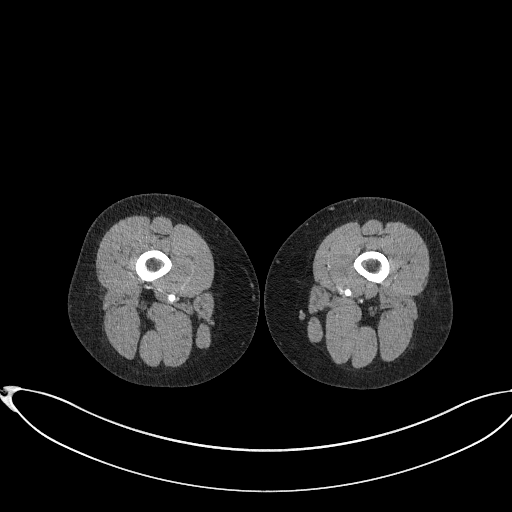
[im 33/273  bone]
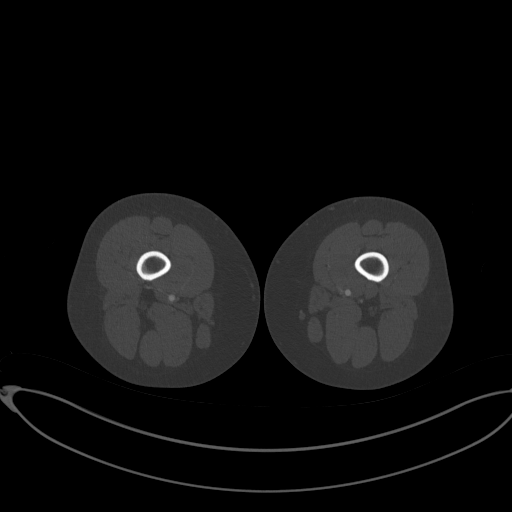
[im 65/273  soft-tissue]
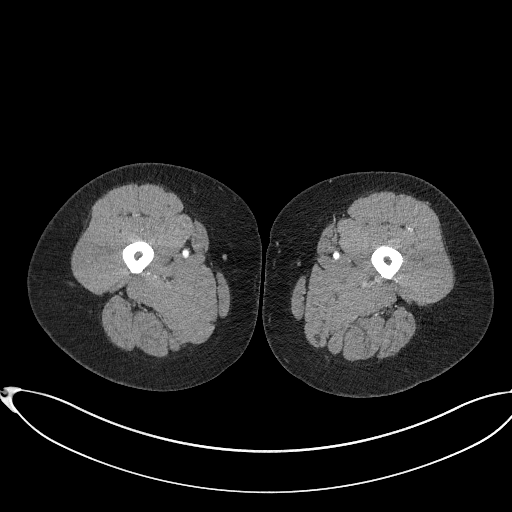
[im 97/273  soft-tissue]
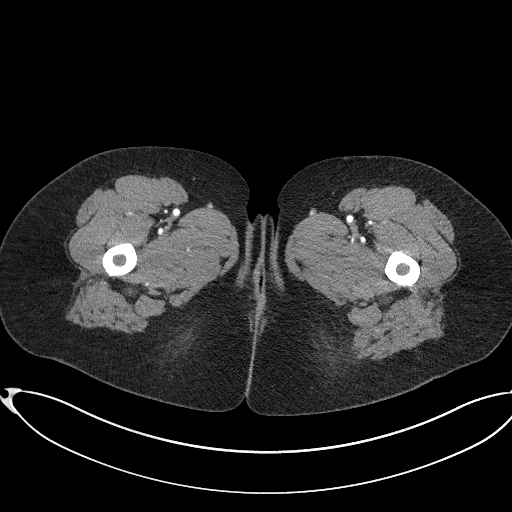
[im 129/273  soft-tissue]
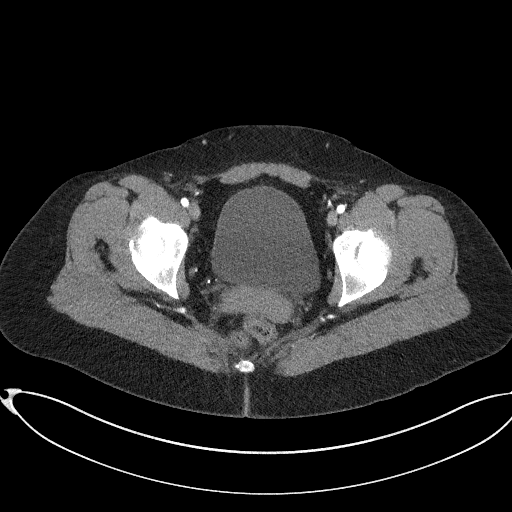
[im 161/273  soft-tissue]
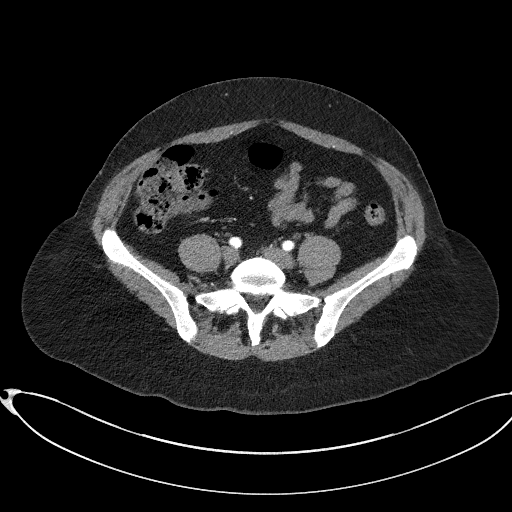
[im 193/273  soft-tissue]
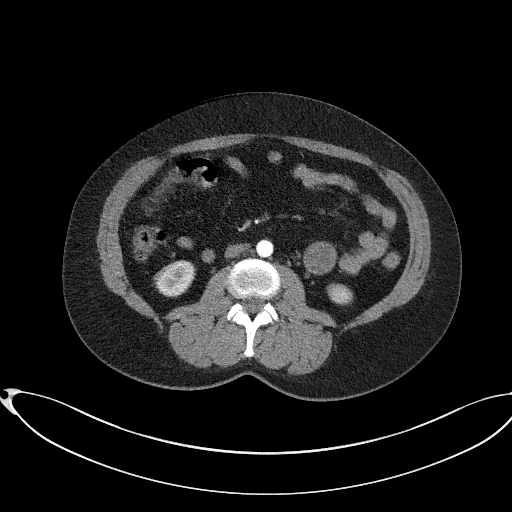
[im 209/273  lung]
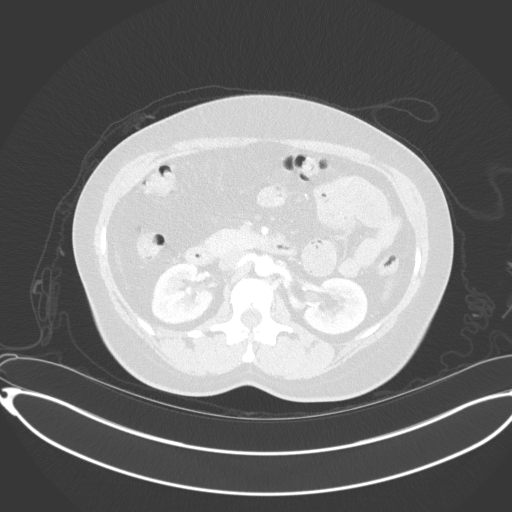
[im 225/273  soft-tissue]
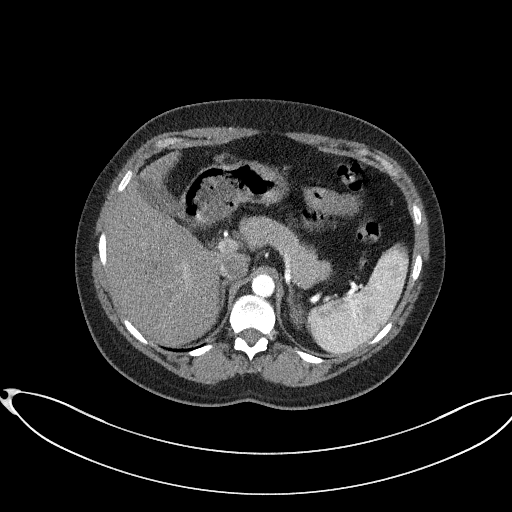
[im 225/273  lung]
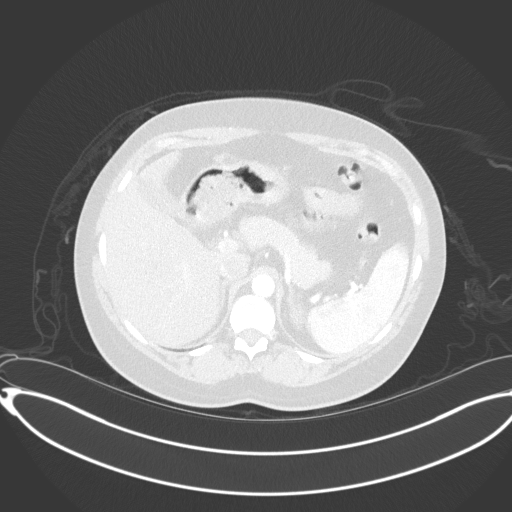
[im 241/273  lung]
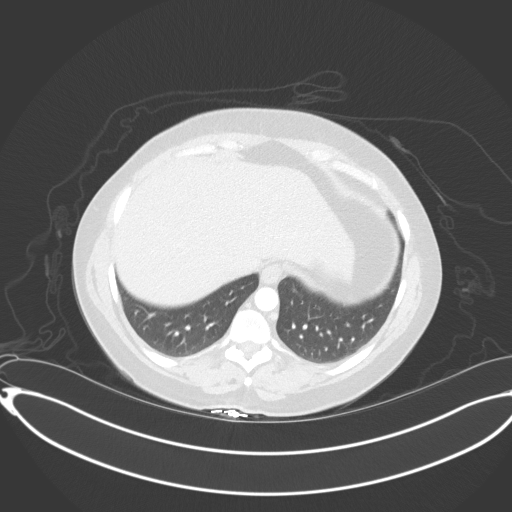
[im 257/273  soft-tissue]
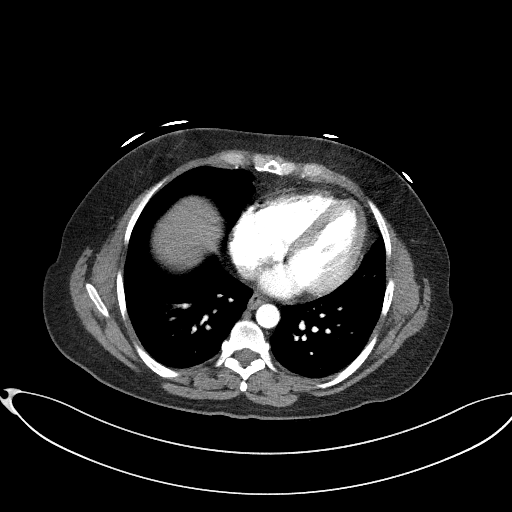
[im 257/273  lung]
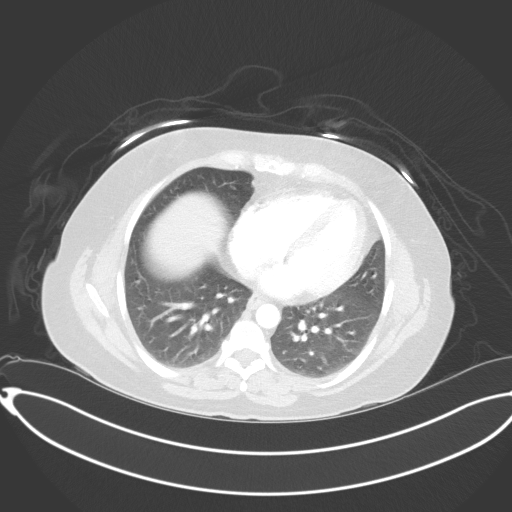

[Series 7: coronal upper · coronal · 0.78mm/px · 1 of 125 slices shown, 2 images]
[im 63/125  soft-tissue]
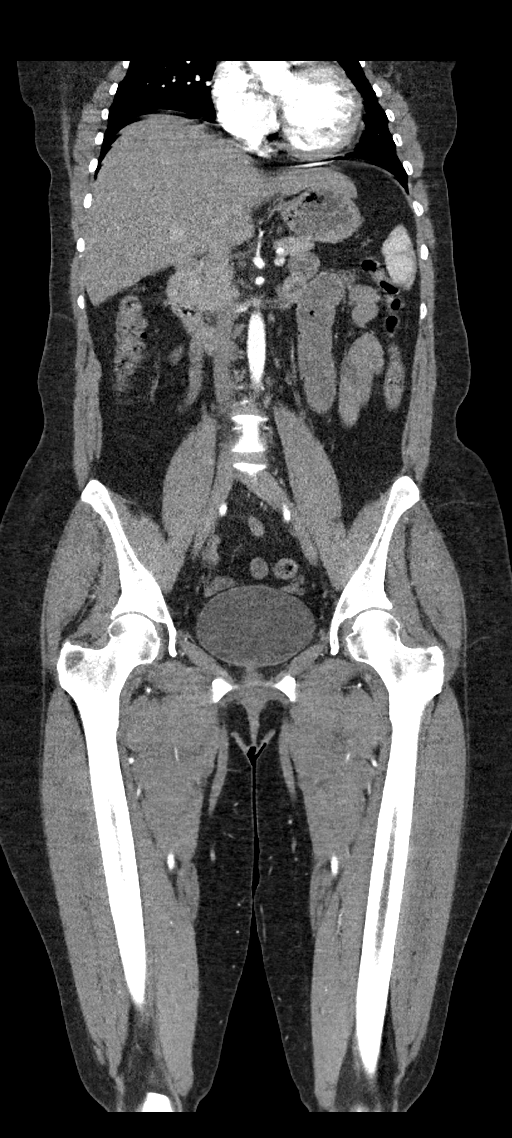
[im 63/125  bone]
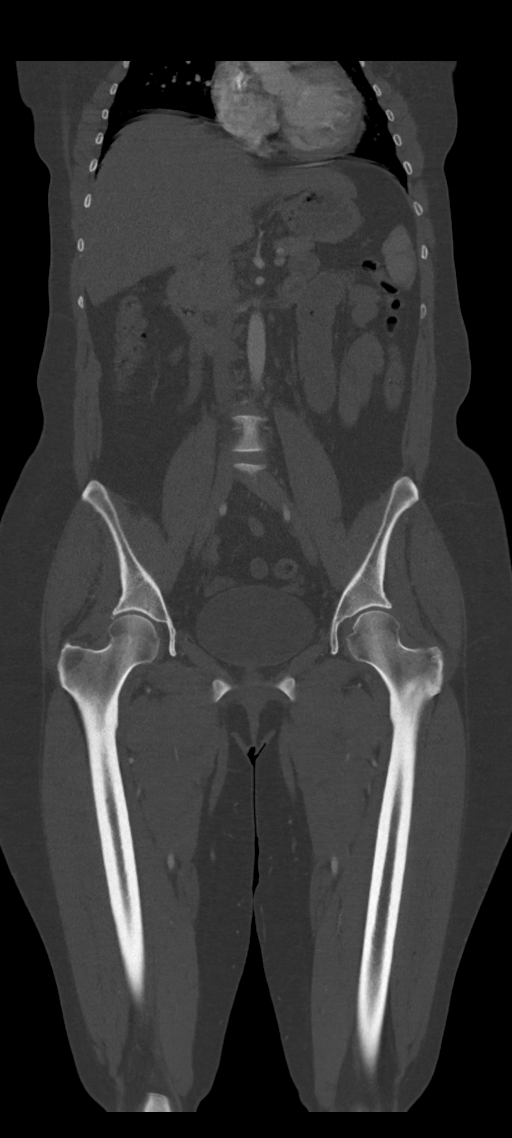

[11 of 46 positions shown; findings below may reference images not displayed]

FINDINGS: VASCULAR

Aorta: Normal caliber aorta without aneurysm, dissection, vasculitis
or significant stenosis.

Celiac: Patent without evidence of aneurysm, dissection, vasculitis
or significant stenosis.

SMA: Patent without evidence of aneurysm, dissection, vasculitis or
significant stenosis.

Renals: Both renal arteries are patent without evidence of aneurysm,
dissection, vasculitis, fibromuscular dysplasia or significant
stenosis.

IMA: Patent without evidence of aneurysm, dissection, vasculitis or
significant stenosis.

RIGHT Lower Extremity

Inflow: Common, internal and external iliac arteries are patent
without evidence of aneurysm, dissection, vasculitis or significant
stenosis.

Outflow: Common, superficial and profunda femoral arteries and the
popliteal artery are patent without evidence of aneurysm,
dissection, vasculitis or significant stenosis.

Runoff: Patent three vessel runoff to the ankle.

LEFT Lower Extremity

Inflow: Common, internal and external iliac arteries are patent
without evidence of aneurysm, dissection, vasculitis or significant
stenosis.

Outflow: Common, superficial and profunda femoral arteries and the
popliteal artery are patent without evidence of aneurysm,
dissection, vasculitis or significant stenosis.

Runoff: Patent three vessel runoff to the ankle.

Veins: No obvious venous abnormality within the limitations of this
arterial phase study.

Review of the MIP images confirms the above findings.

NON-VASCULAR

Lower chest: Atelectasis in the lung bases.

Hepatobiliary: Mild diffuse fatty infiltration of the liver. No
focal lesions. Gallbladder and bile ducts are unremarkable.

Pancreas: Unremarkable. No pancreatic ductal dilatation or
surrounding inflammatory changes.

Spleen: Normal in size without focal abnormality.

Adrenals/Urinary Tract: Adrenal glands are unremarkable. Kidneys are
normal, without renal calculi, focal lesion, or hydronephrosis.
Bladder is unremarkable.

Stomach/Bowel: Stomach is within normal limits. Appendix appears
surgically absent. No evidence of bowel wall thickening, distention,
or inflammatory changes.

Lymphatic: No significant lymphadenopathy.

Reproductive: Status post hysterectomy. No adnexal masses.

Other: No abdominal wall hernia or abnormality. No abdominopelvic
ascites.

Musculoskeletal: No acute or significant osseous findings.
IMPRESSION: VASCULAR

No evidence of significant aneurysm, stenosis, atherosclerotic
changes, or embolus. Aorto iliac and runoff vessels are patent.

NON-VASCULAR

No acute process demonstrated in the abdomen or pelvis.

## 2019-05-09 ENCOUNTER — Ambulatory Visit: Payer: Self-pay | Admitting: Nurse Practitioner

## 2019-05-09 ENCOUNTER — Telehealth (INDEPENDENT_AMBULATORY_CARE_PROVIDER_SITE_OTHER): Payer: Self-pay | Admitting: Nurse Practitioner

## 2019-05-09 ENCOUNTER — Encounter: Payer: Self-pay | Admitting: Nurse Practitioner

## 2019-05-09 DIAGNOSIS — I471 Supraventricular tachycardia: Secondary | ICD-10-CM | POA: Insufficient documentation

## 2019-05-09 DIAGNOSIS — Z7689 Persons encountering health services in other specified circumstances: Secondary | ICD-10-CM

## 2019-05-09 DIAGNOSIS — Z8679 Personal history of other diseases of the circulatory system: Secondary | ICD-10-CM

## 2019-05-09 DIAGNOSIS — M2550 Pain in unspecified joint: Secondary | ICD-10-CM | POA: Insufficient documentation

## 2019-05-09 NOTE — Assessment & Plan Note (Signed)
Chronic, stable with good control at this time.  Continue Diltiazem and adjust regimen as needed.  If worsening symptoms will refer back to cardiology.  Obtain outpatient labs to include lipid panel.  Return in 2 weeks for face to face office visit.

## 2019-05-09 NOTE — Patient Instructions (Signed)
Arthritis Arthritis means joint pain. It can also mean joint disease. A joint is a place where bones come together. There are more than 100 types of arthritis. What are the causes? This condition may be caused by:  Wear and tear of a joint. This is the most common cause.  A lot of acid in the blood, which leads to pain in the joint (gout).  Pain and swelling (inflammation) in a joint.  Infection of a joint.  Injuries in the joint.  A reaction to medicines (allergy). In some cases, the cause may not be known. What are the signs or symptoms? Symptoms of this condition include:  Redness at a joint.  Swelling at a joint.  Stiffness at a joint.  Warmth coming from the joint.  A fever.  A feeling of being sick. How is this treated? This condition may be treated with:  Treating the cause, if it is known.  Rest.  Raising (elevating) the joint.  Putting cold or hot packs on the joint.  Medicines to treat symptoms and reduce pain and swelling.  Shots of medicines (cortisone) into the joint. You may also be told to make changes in your life, such as doing exercises and losing weight. Follow these instructions at home: Medicines  Take over-the-counter and prescription medicines only as told by your doctor.  Do not take aspirin for pain if your doctor says that you may have gout. Activity  Rest your joint if your doctor tells you to.  Avoid activities that make the pain worse.  Exercise your joint regularly as told by your doctor. Try doing exercises like: ? Swimming. ? Water aerobics. ? Biking. ? Walking. Managing pain, stiffness, and swelling      If told, put ice on the affected area. ? Put ice in a plastic bag. ? Place a towel between your skin and the bag. ? Leave the ice on for 20 minutes, 2-3 times per day.  If your joint is swollen, raise (elevate) it above the level of your heart if told by your doctor.  If your joint feels stiff in the morning,  try taking a warm shower.  If told, put heat on the affected area. Do this as often as told by your doctor. Use the heat source that your doctor recommends, such as a moist heat pack or a heating pad. If you have diabetes, do not apply heat without asking your doctor. To apply heat: ? Place a towel between your skin and the heat source. ? Leave the heat on for 20-30 minutes. ? Remove the heat if your skin turns bright red. This is very important if you are unable to feel pain, heat, or cold. You may have a greater risk of getting burned. General instructions  Do not use any products that contain nicotine or tobacco, such as cigarettes, e-cigarettes, and chewing tobacco. If you need help quitting, ask your doctor.  Keep all follow-up visits as told by your doctor. This is important. Contact a doctor if:  The pain gets worse.  You have a fever. Get help right away if:  You have very bad pain in your joint.  You have swelling in your joint.  Your joint is red.  Many joints become painful and swollen.  You have very bad back pain.  Your leg is very weak.  You cannot control your pee (urine) or poop (stool). Summary  Arthritis means joint pain. It can also mean joint disease. A joint is a place   where bones come together.  The most common cause of this condition is wear and tear of a joint.  Symptoms of this condition include redness, swelling, or stiffness of the joint.  This condition is treated with rest, raising the joint, medicines, and putting cold or hot packs on the joint.  Follow your doctor's instructions about medicines, activity, exercises, and other home care treatments. This information is not intended to replace advice given to you by your health care provider. Make sure you discuss any questions you have with your health care provider. Document Revised: 01/15/2018 Document Reviewed: 01/15/2018 Elsevier Patient Education  2020 [employer]  

## 2019-05-09 NOTE — Assessment & Plan Note (Addendum)
Chronic, ongoing for 10 years with worsening.  Will obtain outpatient labs to include CBC, CMP, urinalysis, CRP, ESR, uric acid, ANA, Rf, Vit D and B12 and TSH.  If abnormal findings will refer to rheumatology for further work-up due to long standing history of arthritic pain with difficulty performing ADLs at this time.  Continue Aleeve and Tylenol as needed at home.  Return to office in 2 weeks for follow-up.

## 2019-05-09 NOTE — Assessment & Plan Note (Signed)
Previously followed by cardiology and asymptomatic at this time.  Continue to monitor and adjust plan of care as needed dependent on symptoms and findings.

## 2019-05-09 NOTE — Progress Notes (Signed)
New Patient Office Visit  Subjective:  Patient ID: Shelby Crosby, female    DOB: [birth year]  Age: 39 y.o. MRN: [id number]  CC:  Chief Complaint  Patient presents with  . Establish Care    pt states she wants to discuss a referral ro rheumatology, states she is concerned with Lupus    . This visit was completed via MyChart due to the restrictions of the COVID-19 pandemic. All issues as above were discussed and addressed. Physical exam was done as above through visual confirmation on MyChart. If it was felt that the patient should be evaluated in the office, they were directed there. The patient verbally consented to this visit. . Location of the patient: home . Location of the provider: home . Those involved with this call:  . Provider: Marnee Guarneri, DNP . CMA: Yvonna Alanis, CMA . Front Desk/Registration: Don Perking  . Time spent on call: 30 minutes with patient face to face via video conference. More than 50% of this time was spent in counseling and coordination of care. 15 minutes total spent in review of patient's record and preparation of their chart.  . I verified patient identity using two factors (patient name and date of birth). Patient consents verbally to being seen via telemedicine visit today.    HPI Shelby Crosby presents for new patient visit to establish care.  Introduced to Designer, jewellery role and practice setting.  All questions answered.  Has length history, multiple ER visits noted over past 2 years.  CARDIAC HISTORY: Was followed by [facility] cardiology in past. Last visit with Dr. Aurea Graff was in June 2019.  Followed for SVT, mild mitral regurgitation, and MVP.  On review of notes has a length cardiac history with 10 years of work-up, extending to negative cardiac catheter in 2009 and negative stress testing in 2016.  At last cardiology visit in June 2019 she has requested ablation due to symptoms, but the Holter showed less then 1% events and she  did not qualify for ablation.  On review has tried BB for symptoms which caused fatigue and did have some success for awhile on Diltiazem 240 MG daily, but at last cardiology visit this appeared to not be working as well.    At this time she continues on Diltiazem and reports benefit from this. Aspirin: no Recurrent headaches: no Visual changes: no Palpitations: no Dyspnea: yes with exertion Chest pain: chest wall pain intermittent  Lower extremity edema: no Dizzy/lightheaded: no  ARTHRITIS: For past 10 years has had arthritis symptoms, that she has marked off as arthritis because of her family history.  Has family history of RA.  She is concerned for lupus.  Recently started having worsening left and right elbow pain + hot sensations/tingling in her feet & hands dependent on how she is lying or holding phone.  Reports worsening chest muscle pain, under her left rib with burning sensation.  Pain is present all day, worse in morning and at night.  Makes performing ADLs difficult.  Chest pain will come and go, but joint pain is constant, especially in elbows.  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  States she does bruise easier recently.  Around her stomach area feels like sand paper type of rash.  Fatigued all the time.  Has lost about 5 pounds over past months.  Denies hair loss or oral/nasal ulcers. Does have ongoing SOB with exertion which has been present for years. Duration: 10 years with worsening  Pain: yes Symmetric: yes  8/10 Quality: dull, aching, burning and ill-defined Frequency: intermittent Context:  worse Decreased function/range of motion: yes Erythema: none Swelling: none Heat or warmth: none Morning stiffness: yes Aggravating factors:  Alleviating factors:  Relief with NSAIDs?: moderate Treatments attempted:  ice, heat, APAP and aleve  Involved Joints:     Hands: yes bilateral    Wrists: yes bilateral     Elbows: yes bilateral  -- worst area -- constant pain here    Shoulders: yes bilateral    Back: yes     Hips: yes bilateral    Knees: yes bilateral    Ankles: yes bilateral    Feet: yes bilateral   Past Medical History:  Diagnosis Date  . Atrial fibrillation ([place])   . Endometriosis   . Mitral valve prolapse   . SVT (supraventricular tachycardia) (HCC)     Past Surgical History:  Procedure Laterality Date  . ABDOMINAL HYSTERECTOMY     partial  . APPENDECTOMY      Family History  Problem Relation Age of Onset  . Diverticulitis Mother   . Raynaud syndrome Daughter   . Raynaud syndrome Son   . Diabetes Paternal Grandmother   . Hypertension Paternal Grandmother     Social History   Socioeconomic History  . Marital status: Married    Spouse name: Not on file  . Number of children: Not on file  . Years of education: Not on file  . Highest education level: Not on file  Occupational History    Comment: self employed -- Curator, Emergency planning/management officer  Tobacco Use  . Smoking status: Never Smoker  . Smokeless tobacco: Never Used  Substance and Sexual Activity  . Alcohol use: No  . Drug use: No  . Sexual activity: Yes  Other Topics Concern  . Not on file  Social History Narrative  . Not on file   Social Determinants of Health   Financial Resource Strain: Low Risk   . Difficulty of Paying Living Expenses: Not hard at all  Food Insecurity: No Food Insecurity  . Worried About Charity fundraiser in the Last Year: Never true  . Ran Out of Food in the Last Year: Never true  Transportation Needs: No Transportation Needs  . Lack of Transportation (Medical): No  . Lack of Transportation (Non-Medical): No  Physical Activity: Inactive  . Days of Exercise per Week: 0 days  . Minutes of Exercise per Session: 0 min  Stress: Stress Concern Present  . Feeling of Stress : To some extent  Social Connections: Unknown  . Frequency of Communication with Friends and Family: Twice a week  . Frequency of Social  Gatherings with Friends and Family: Twice a week  . Attends Religious Services: Never  . Active Member of Clubs or Organizations: No  . Attends Archivist Meetings: Never  . Marital Status: Not on file  Intimate Partner Violence:   . Fear of Current or Ex-Partner:   . Emotionally Abused:   Marland Kitchen Physically Abused:   . Sexually Abused:     ROS Review of Systems  Constitutional: Positive for fatigue. Negative for activity change, appetite change, diaphoresis and fever.  Respiratory: Positive for shortness of breath (baseline). Negative for cough, chest tightness and wheezing.   Cardiovascular: Negative for palpitations and leg swelling.  Gastrointestinal: Negative.   Endocrine: Negative for cold intolerance and heat intolerance.  Genitourinary: Negative.   Musculoskeletal: Positive for arthralgias and back pain. Negative for gait problem  and joint swelling.  Skin: Positive for rash (no erythema, belly feels like sand paper).  Neurological: Negative.   Psychiatric/Behavioral: Negative.     Objective:   Today's Vitals: There were no vitals taken for this visit.  Physical Exam Vitals and nursing note reviewed.  Constitutional:      General: She is awake. She is not in acute distress.    Appearance: She is well-developed. She is not ill-appearing.  HENT:     Head: Normocephalic.     Right Ear: Hearing normal.     Left Ear: Hearing normal.  Eyes:     General: Lids are normal.        Right eye: No discharge.        Left eye: No discharge.     Conjunctiva/sclera: Conjunctivae normal.  Pulmonary:     Effort: Pulmonary effort is normal. No accessory muscle usage or respiratory distress.  Musculoskeletal:     Cervical back: Normal range of motion.  Neurological:     Mental Status: She is alert and oriented to person, place, and time.  Psychiatric:        Attention and Perception: Attention normal.        Mood and Affect: Mood normal.        Behavior: Behavior normal.  Behavior is cooperative.        Thought Content: Thought content normal.        Judgment: Judgment normal.     Assessment & Plan:   Problem List Items Addressed This Visit      Cardiovascular and Mediastinum   SVT (supraventricular tachycardia) (HCC)    Chronic, stable with good control at this time.  Continue Diltiazem and adjust regimen as needed.  If worsening symptoms will refer back to cardiology.  Obtain outpatient labs to include lipid panel.  Return in 2 weeks for face to face office visit.      Relevant Medications   diltiazem (TIAZAC) 120 MG 24 hr capsule   Other Relevant Orders   Comprehensive metabolic panel   TSH   Lipid Panel w/o Chol/HDL Ratio     Other   History of mitral valve prolapse    Previously followed by cardiology and asymptomatic at this time.  Continue to monitor and adjust plan of care as needed dependent on symptoms and findings.        Arthralgia    Chronic, ongoing for 10 years with worsening.  Will obtain outpatient labs to include CBC, CMP, urinalysis, CRP, ESR, uric acid, ANA, Rf, Vit D and B12 and TSH.  If abnormal findings will refer to rheumatology for further work-up due to long standing history of arthritic pain with difficulty performing ADLs at this time.  Continue Aleeve and Tylenol as needed at home.  Return to office in 2 weeks for follow-up.      Relevant Orders   ANA w/Reflex if Positive   Rheumatoid factor   CBC with Differential/Platelet   Comprehensive metabolic panel   TSH   C-reactive protein   Sed Rate (ESR)   UA/M w/rflx Culture, Routine   Uric acid   Vitamin B12   VITAMIN D 25 Hydroxy (Vit-D Deficiency, Fractures)    Other Visit Diagnoses    Encounter to establish care    -  Primary      Outpatient Encounter Medications as of 05/09/2019  Medication Sig  . diltiazem (TIAZAC) 120 MG 24 hr capsule Take 240 mg by mouth daily.  . [DISCONTINUED] azithromycin (ZITHROMAX Z-PAK)  250 MG tablet 500 mg po on day one, then  250 mg po q day on days 2-5 (Patient not taking: Reported on 04/04/2014)  . [DISCONTINUED] cefUROXime (CEFTIN) 500 MG tablet Take 1 tablet (500 mg total) by mouth 2 (two) times daily.  . [DISCONTINUED] chlorpheniramine-HYDROcodone (TUSSIONEX PENNKINETIC ER) 10-8 MG/5ML SUER Take 5 mLs by mouth every 12 (twelve) hours as needed for cough.  . [DISCONTINUED] fexofenadine-pseudoephedrine (ALLEGRA-D ALLERGY & CONGESTION) 180-240 MG 24 hr tablet Take 1 tablet by mouth daily.  . [DISCONTINUED] ondansetron (ZOFRAN ODT) 8 MG disintegrating tablet Take 1 tablet (8 mg total) by mouth every 8 (eight) hours as needed for nausea or vomiting.  . [DISCONTINUED] ondansetron (ZOFRAN) 4 MG tablet Take 1 tablet (4 mg total) by mouth every 6 (six) hours.  . [DISCONTINUED] traMADol (ULTRAM) 50 MG tablet Take 1 tablet (50 mg total) by mouth every 6 (six) hours as needed.   No facility-administered encounter medications on file as of 05/09/2019.   I discussed the assessment and treatment plan with the patient. The patient was provided an opportunity to ask questions and all were answered. The patient agreed with the plan and demonstrated an understanding of the instructions.   The patient was advised to call back or seek an in-person evaluation if the symptoms worsen or if the condition fails to improve as anticipated.   I provided 30+ minutes of time during this encounter.  Follow-up: Return in about 2 weeks (around 05/23/2019) for Office visit face to face.   Venita Lick, NP

## 2019-05-13 ENCOUNTER — Other Ambulatory Visit (INDEPENDENT_AMBULATORY_CARE_PROVIDER_SITE_OTHER): Payer: Self-pay

## 2019-05-13 ENCOUNTER — Telehealth: Payer: Self-pay | Admitting: Nurse Practitioner

## 2019-05-13 ENCOUNTER — Other Ambulatory Visit: Payer: Self-pay

## 2019-05-13 ENCOUNTER — Encounter: Payer: Self-pay | Admitting: Nurse Practitioner

## 2019-05-13 DIAGNOSIS — M2550 Pain in unspecified joint: Secondary | ICD-10-CM

## 2019-05-13 DIAGNOSIS — I471 Supraventricular tachycardia: Secondary | ICD-10-CM

## 2019-05-13 LAB — UA/M W/RFLX CULTURE, ROUTINE
Bilirubin, UA: NEGATIVE
Glucose, UA: NEGATIVE
Ketones, UA: NEGATIVE
Leukocytes,UA: NEGATIVE
Nitrite, UA: NEGATIVE
Protein,UA: NEGATIVE
Specific Gravity, UA: 1.025 (ref 1.005–1.030)
Urobilinogen, Ur: 0.2 mg/dL (ref 0.2–1.0)
pH, UA: 6 (ref 5.0–7.5)

## 2019-05-13 LAB — MICROSCOPIC EXAMINATION
Bacteria, UA: NONE SEEN
WBC, UA: NONE SEEN /hpf (ref 0–5)

## 2019-05-13 NOTE — Telephone Encounter (Signed)
LVM for pt to call back to schedule an appt.  

## 2019-05-13 NOTE — Telephone Encounter (Signed)
Letter printed to mail as well.  °

## 2019-05-13 NOTE — Telephone Encounter (Signed)
-----   Message from Marjie Skiff, NP sent at 05/09/2019  9:33 AM EDT ----- In 2-3 weeks office visit -- 30 minute slot, if able to schedule physical with Medicaid for this please do that, if not then just 30 minute visit + NEEDS outpatient lab visit tomorrow or over next week

## 2019-05-14 LAB — COMPREHENSIVE METABOLIC PANEL
ALT: 14 IU/L (ref 0–32)
AST: 11 IU/L (ref 0–40)
Albumin/Globulin Ratio: 1.7 (ref 1.2–2.2)
Albumin: 4.5 g/dL (ref 3.8–4.8)
Alkaline Phosphatase: 73 IU/L (ref 39–117)
BUN/Creatinine Ratio: 17 (ref 9–23)
BUN: 15 mg/dL (ref 6–24)
Bilirubin Total: 0.8 mg/dL (ref 0.0–1.2)
CO2: 22 mmol/L (ref 20–29)
Calcium: 9.4 mg/dL (ref 8.7–10.2)
Chloride: 106 mmol/L (ref 96–106)
Creatinine, Ser: 0.87 mg/dL (ref 0.57–1.00)
GFR calc Af Amer: 96 mL/min/{1.73_m2} (ref >59)
GFR calc non Af Amer: 83 mL/min/{1.73_m2} (ref >59)
Globulin, Total: 2.7 g/dL (ref 1.5–4.5)
Glucose: 100 mg/dL — ABNORMAL HIGH (ref 65–99)
Potassium: 4.1 mmol/L (ref 3.5–5.2)
Sodium: 142 mmol/L (ref 134–144)
Total Protein: 7.2 g/dL (ref 6.0–8.5)

## 2019-05-14 LAB — TSH: TSH: 1.55 u[IU]/mL (ref 0.450–4.500)

## 2019-05-14 LAB — CBC WITH DIFFERENTIAL/PLATELET
Basophils Absolute: 0.1 10*3/uL (ref 0.0–0.2)
Basos: 1 %
EOS (ABSOLUTE): 0.1 10*3/uL (ref 0.0–0.4)
Eos: 2 %
Hematocrit: 43.5 % (ref 34.0–46.6)
Hemoglobin: 14.5 g/dL (ref 11.1–15.9)
Immature Grans (Abs): 0 10*3/uL (ref 0.0–0.1)
Immature Granulocytes: 0 %
Lymphocytes Absolute: 1.7 10*3/uL (ref 0.7–3.1)
Lymphs: 25 %
MCH: 28.6 pg (ref 26.6–33.0)
MCHC: 33.3 g/dL (ref 31.5–35.7)
MCV: 86 fL (ref 79–97)
Monocytes Absolute: 0.4 10*3/uL (ref 0.1–0.9)
Monocytes: 6 %
Neutrophils Absolute: 4.5 10*3/uL (ref 1.4–7.0)
Neutrophils: 66 %
Platelets: 347 10*3/uL (ref 150–450)
RBC: 5.07 x10E6/uL (ref 3.77–5.28)
RDW: 12.3 % (ref 11.7–15.4)
WBC: 6.8 10*3/uL (ref 3.4–10.8)

## 2019-05-14 LAB — SEDIMENTATION RATE: Sed Rate: 6 mm/hr (ref 0–32)

## 2019-05-14 LAB — RHEUMATOID FACTOR: Rheumatoid fact SerPl-aCnc: <10 IU/mL (ref 0.0–13.9)

## 2019-05-14 LAB — VITAMIN B12: Vitamin B-12: 413 pg/mL (ref 232–1245)

## 2019-05-14 LAB — LIPID PANEL W/O CHOL/HDL RATIO
Cholesterol, Total: 159 mg/dL (ref 100–199)
HDL: 49 mg/dL (ref >39)
LDL Chol Calc (NIH): 85 mg/dL (ref 0–99)
Triglycerides: 146 mg/dL (ref 0–149)
VLDL Cholesterol Cal: 25 mg/dL (ref 5–40)

## 2019-05-14 LAB — C-REACTIVE PROTEIN: CRP: 3 mg/L (ref 0–10)

## 2019-05-14 LAB — ANA W/REFLEX IF POSITIVE: Anti Nuclear Antibody (ANA): NEGATIVE

## 2019-05-14 LAB — VITAMIN D 25 HYDROXY (VIT D DEFICIENCY, FRACTURES): Vit D, 25-Hydroxy: 27.7 ng/mL — ABNORMAL LOW (ref 30.0–100.0)

## 2019-05-14 LAB — URIC ACID: Uric Acid: 4.1 mg/dL (ref 2.6–6.2)

## 2019-05-14 NOTE — Progress Notes (Signed)
Attempted to call patient and left HIPAA complaint message.  Please call patient in morning and let her know following" Lanae Boast, all of your labs including rheumatoid testing, have returned normal or negative with exception of a few things: - Vitamin D level is low, I do recommend you start taking Vitamin D3 1000 units daily for overall bone health. - Vitamin B12 level is on lower end of normal.  You may benefit from starting to take Vitamin B12 1000 MCG daily which you can obtain in vitamin section. - Urine did show a trace of blood, do you have menstrual cycle right now.  If not I would like to recheck this at next visit in April.  If still there may run further tests or send to urology.  If any questions let me know.  We can further discuss all this at upcoming appointment.

## 2019-05-15 ENCOUNTER — Ambulatory Visit: Payer: Self-pay | Admitting: Nurse Practitioner

## 2019-05-16 ENCOUNTER — Other Ambulatory Visit: Payer: Self-pay | Admitting: Nurse Practitioner

## 2019-05-16 ENCOUNTER — Telehealth: Payer: Self-pay

## 2019-05-16 MED ORDER — VITAMIN B-12 1000 MCG PO TABS: 1000.0000 ug | ORAL_TABLET | Freq: Every day | ORAL | 3 refills | Status: AC

## 2019-05-16 NOTE — Telephone Encounter (Signed)
Copied from CRM [id number]. Topic: General - Other >> May 16, 2019  9:10 AM Jaquita Rector A wrote: Reason for [id number] called to inquire if Aura Dials can please write her Rx for Vitamin B-12 shots. Please send to pharmacy and patient can be reached at Ph# [phone]   Routing to provider.

## 2019-05-16 NOTE — Telephone Encounter (Signed)
I have sent in oral, since she is on low side of normal and not extremely low oral supplement will offer as much benefit as the shots would.  Definitely recommend she start taking oral supplement daily.

## 2019-05-16 NOTE — Telephone Encounter (Signed)
Patient notified and verbalized understanding. 

## 2019-06-03 ENCOUNTER — Ambulatory Visit: Payer: Self-pay | Admitting: Nurse Practitioner

## 2019-06-28 ENCOUNTER — Telehealth: Payer: Self-pay | Admitting: Nurse Practitioner

## 2019-06-28 ENCOUNTER — Ambulatory Visit: Payer: Self-pay | Admitting: Nurse Practitioner

## 2019-06-28 NOTE — Telephone Encounter (Signed)
Would like to recheck level of B12 prior to sending injection, as her level was normal range, but on lower side.  So suspect the oral supplement will offer benefit, in data it is often equal to injections in regard to benefit.  Would like to check first to see if increase in level.  Thank you.

## 2019-06-28 NOTE — Telephone Encounter (Signed)
Pt called and cancelled her appt for today, stating she does not have the money to pay for OV and labs too. All she says she needs is a Rx for the B12 injections.  She does not believe the OTC B12 is working.  Please send to  [facility] [address] ([address]), [place] - [address] Phone:  [phone]  Fax:  [phone]

## 2019-07-02 NOTE — Telephone Encounter (Signed)
Patient notified

## 2019-08-08 ENCOUNTER — Other Ambulatory Visit: Payer: Self-pay

## 2019-08-08 ENCOUNTER — Emergency Department: Payer: Self-pay

## 2019-08-08 ENCOUNTER — Emergency Department
Admission: EM | Admit: 2019-08-08 | Discharge: 2019-08-08 | Disposition: A | Discharge status: Home / Self Care | Payer: Self-pay | Attending: Emergency Medicine | Admitting: Emergency Medicine

## 2019-08-08 ENCOUNTER — Encounter: Payer: Self-pay | Admitting: Emergency Medicine

## 2019-08-08 DIAGNOSIS — Z5321 Procedure and treatment not carried out due to patient leaving prior to being seen by health care provider: Secondary | ICD-10-CM | POA: Insufficient documentation

## 2019-08-08 DIAGNOSIS — M25512 Pain in left shoulder: Secondary | ICD-10-CM | POA: Insufficient documentation

## 2019-08-08 LAB — CBC WITH DIFFERENTIAL/PLATELET
Abs Immature Granulocytes: 0.04 10*3/uL (ref 0.00–0.07)
Basophils Absolute: 0.1 10*3/uL (ref 0.0–0.1)
Basophils Relative: 1 %
Eosinophils Absolute: 0.3 10*3/uL (ref 0.0–0.5)
Eosinophils Relative: 4 %
HCT: 42.7 % (ref 36.0–46.0)
Hemoglobin: 14.6 g/dL (ref 12.0–15.0)
Immature Granulocytes: 1 %
Lymphocytes Relative: 36 %
Lymphs Abs: 3.1 10*3/uL (ref 0.7–4.0)
MCH: 29.1 pg (ref 26.0–34.0)
MCHC: 34.2 g/dL (ref 30.0–36.0)
MCV: 85.2 fL (ref 80.0–100.0)
Monocytes Absolute: 0.7 10*3/uL (ref 0.1–1.0)
Monocytes Relative: 8 %
Neutro Abs: 4.2 10*3/uL (ref 1.7–7.7)
Neutrophils Relative %: 50 %
Platelets: 378 10*3/uL (ref 150–400)
RBC: 5.01 MIL/uL (ref 3.87–5.11)
RDW: 11.9 % (ref 11.5–15.5)
WBC: 8.4 10*3/uL (ref 4.0–10.5)
nRBC: 0 % (ref 0.0–0.2)

## 2019-08-08 LAB — COMPREHENSIVE METABOLIC PANEL
ALT: 18 U/L (ref 0–44)
AST: 16 U/L (ref 15–41)
Albumin: 4.7 g/dL (ref 3.5–5.0)
Alkaline Phosphatase: 70 U/L (ref 38–126)
Anion gap: 7 (ref 5–15)
BUN: 15 mg/dL (ref 6–20)
CO2: 27 mmol/L (ref 22–32)
Calcium: 9.2 mg/dL (ref 8.9–10.3)
Chloride: 104 mmol/L (ref 98–111)
Creatinine, Ser: 0.92 mg/dL (ref 0.44–1.00)
GFR calc Af Amer: >60 mL/min (ref >60)
GFR calc non Af Amer: >60 mL/min (ref >60)
Glucose, Bld: 109 mg/dL — ABNORMAL HIGH (ref 70–99)
Potassium: 3.8 mmol/L (ref 3.5–5.1)
Sodium: 138 mmol/L (ref 135–145)
Total Bilirubin: 0.8 mg/dL (ref 0.3–1.2)
Total Protein: 7.7 g/dL (ref 6.5–8.1)

## 2019-08-08 LAB — TROPONIN I (HIGH SENSITIVITY): Troponin I (High Sensitivity): 3 ng/L (ref <18)

## 2019-08-08 LAB — FIBRIN DERIVATIVES D-DIMER (ARMC ONLY): Fibrin derivatives D-dimer (ARMC): 206.6 ng/mL (FEU) (ref 0.00–499.00)

## 2019-08-08 NOTE — ED Triage Notes (Signed)
Patient ambulatory to triage with steady gait, without difficulty or distress noted, mask in place; Pt reports awoke at midnight with pain to left shoulder radiating down arm and into scapula accomp by [facility]; denies hx of same

## 2019-11-20 ENCOUNTER — Ambulatory Visit
Admission: EM | Admit: 2019-11-20 | Discharge: 2019-11-20 | Disposition: A | Discharge status: Home / Self Care | Payer: Medicaid Other | Attending: Emergency Medicine | Admitting: Emergency Medicine

## 2019-11-20 ENCOUNTER — Other Ambulatory Visit: Payer: Self-pay

## 2019-11-20 ENCOUNTER — Encounter: Payer: Self-pay | Admitting: Emergency Medicine

## 2019-11-20 DIAGNOSIS — L03114 Cellulitis of left upper limb: Secondary | ICD-10-CM

## 2019-11-20 MED ORDER — IBUPROFEN 600 MG PO TABS
600.0000 mg | ORAL_TABLET | Freq: Four times a day (QID) | ORAL | 0 refills | Status: AC | PRN
Start: 2019-11-20 — End: ?

## 2019-11-20 MED ORDER — DOXYCYCLINE HYCLATE 100 MG PO CAPS: 100.0000 mg | ORAL_CAPSULE | Freq: Two times a day (BID) | ORAL | 0 refills | Status: AC

## 2019-11-20 MED ORDER — HIBICLENS 4 % EX LIQD: Freq: Every day | CUTANEOUS | 0 refills | Status: AC | PRN

## 2019-11-20 NOTE — ED Triage Notes (Signed)
Pt c/o left hand pain and swelling. Started about 3 days ago. She states the pain radiates up her elbow. She can not make a fist. No known injury.

## 2019-11-20 NOTE — Discharge Instructions (Addendum)
May take 1000 mg of Tylenol combined with 600 mg of ibuprofen together 3-4 times a day as needed.  Ice your hand.  Finish the doxycycline, even if you feel better.  Return here or see your doctor if the redness and swelling go outside of the line that we made today.

## 2019-11-20 NOTE — ED Provider Notes (Signed)
HPI  SUBJECTIVE:  Shelby Crosby is a right-handed 39 y.o. female who presents with pain, erythema, edema of the dorsum of her left hand starting 3 days ago.  States the pain radiates up her elbow.  She reports some left axillary pain as well.  No known trauma to the area, change in her physical activity.  Symptoms are worse when she tries to make a fist, no alleviating factors.  She has a past medical history of MRSA.  No history of diabetes or hypertension.  LMP: March 2006.  DVV:[id number], Dorie Rank, NP     Past Medical History:  Diagnosis Date  . Atrial fibrillation (HCC)   . Endometriosis   . Mitral valve prolapse   . SVT (supraventricular tachycardia) (HCC)     Past Surgical History:  Procedure Laterality Date  . ABDOMINAL HYSTERECTOMY     partial  . APPENDECTOMY      Family History  Problem Relation Age of Onset  . Diverticulitis Mother   . Raynaud syndrome Daughter   . Raynaud syndrome Son   . Diabetes Paternal Grandmother   . Hypertension Paternal Grandmother     Social History   Tobacco Use  . Smoking status: Never Smoker  . Smokeless tobacco: Never Used  Vaping Use  . Vaping Use: Never used  Substance Use Topics  . Alcohol use: No  . Drug use: No    No current facility-administered medications for this encounter.  Current Outpatient Medications:  .  diltiazem (TIAZAC) 120 MG 24 hr capsule, Take 240 mg by mouth daily., Disp: , Rfl:  .  chlorhexidine (HIBICLENS) 4 % external liquid, Apply topically daily as needed. Dilute 10-15 mL in water, Use daily when bathing for 1-2 weeks, Disp: 120 mL, Rfl: 0 .  doxycycline (VIBRAMYCIN) 100 MG capsule, Take 1 capsule (100 mg total) by mouth 2 (two) times daily for 7 days., Disp: 14 capsule, Rfl: 0 .  ibuprofen (ADVIL) 600 MG tablet, Take 1 tablet (600 mg total) by mouth every 6 (six) hours as needed., Disp: 30 tablet, Rfl: 0 .  vitamin B-12 (CYANOCOBALAMIN) 1000 MCG tablet, Take 1 tablet (1,000 mcg total) by mouth  daily., Disp: 90 tablet, Rfl: 3  Allergies  Allergen Reactions  . Latex   . Penicillins Hives and Swelling  . Promethazine Itching     ROS  As noted in HPI.   Physical Exam  BP 125/85 (BP Location: Right Arm)   Pulse 74   Temp 98.2 F (36.8 C) (Oral)   Resp 18   Ht 5\' 4"  (1.626 m)   Wt 84.4 kg   SpO2 100%   BMI 31.94 kg/m   Constitutional: Well developed, well nourished, no acute distress Eyes:  EOMI, conjunctiva normal bilaterally HENT: Normocephalic, atraumatic,mucus membranes moist Respiratory: Normal inspiratory effort Cardiovascular: Normal rate GI: nondistended skin: Tender swelling dorsum left hand. see musculoskeletal exam Musculoskeletal: 7 x 6 cm tender area of erythema, edema dorsum of the left hand.  No tenderness over the phalanges, carpals, wrist.  Marked area of erythema, swelling with marker.  No tenderness of the metacarpals over the palm.  Skin intact., no induration, fluctuance. no deformities     Lymph: Positive tender left axillary lymphadenopathy  Neurologic: Alert & oriented x 3, no focal neuro deficits Psychiatric: Speech and behavior appropriate   ED Course   Medications - No data to display  No orders of the defined types were placed in this encounter.   No results found for this  or any previous visit (from the past 24 hour(s)). No results found.  ED Clinical Impression  1. Cellulitis of left upper extremity      ED Assessment/Plan  Concern for cellulitis, especially with tender axillary lymphadenopathy.  Doubt fracture in the absence of trauma, bruising, deformity.  Imaging was deferred.  With history of MRSA, will send home with doxycycline for 7 days, Hibiclens.  Tylenol/ibuprofen together.  Follow-up with PMD at [facility] or here if not getting any better in 3 days, to the ER if she gets significantly worse or for systemic symptoms.  Discussed MDM, treatment plan, and plan for follow-up with patient.  Discussed sn/sx that should prompt return to the ED. patient agrees with plan.   Meds ordered this encounter  Medications  . doxycycline (VIBRAMYCIN) 100 MG capsule    Sig: Take 1 capsule (100 mg total) by mouth 2 (two) times daily for 7 days.    Dispense:  14 capsule    Refill:  0  . chlorhexidine (HIBICLENS) 4 % external liquid    Sig: Apply topically daily as needed. Dilute 10-15 mL in water, Use daily when bathing for 1-2 weeks    Dispense:  120 mL    Refill:  0  . ibuprofen (ADVIL) 600 MG tablet    Sig: Take 1 tablet (600 mg total) by mouth every 6 (six) hours as needed.    Dispense:  30 tablet    Refill:  0    *This clinic note was created using Scientist, clinical (histocompatibility and immunogenetics). Therefore, there may be occasional mistakes despite careful proofreading.   ?    Domenick Gong, MD 11/21/19 [phone]

## 2020-08-09 IMAGING — CR DG CHEST 2V
1 series · 2 of 2 positions shown · non-contrast
Comparison: March 06, 2016

CLINICAL DATA: Shortness of breath

EXAM:
CHEST - 2 VIEW

[Series 1: dg chest 2 view · 0.14mm/px · 2 of 2 slices shown]
[im 1/2]
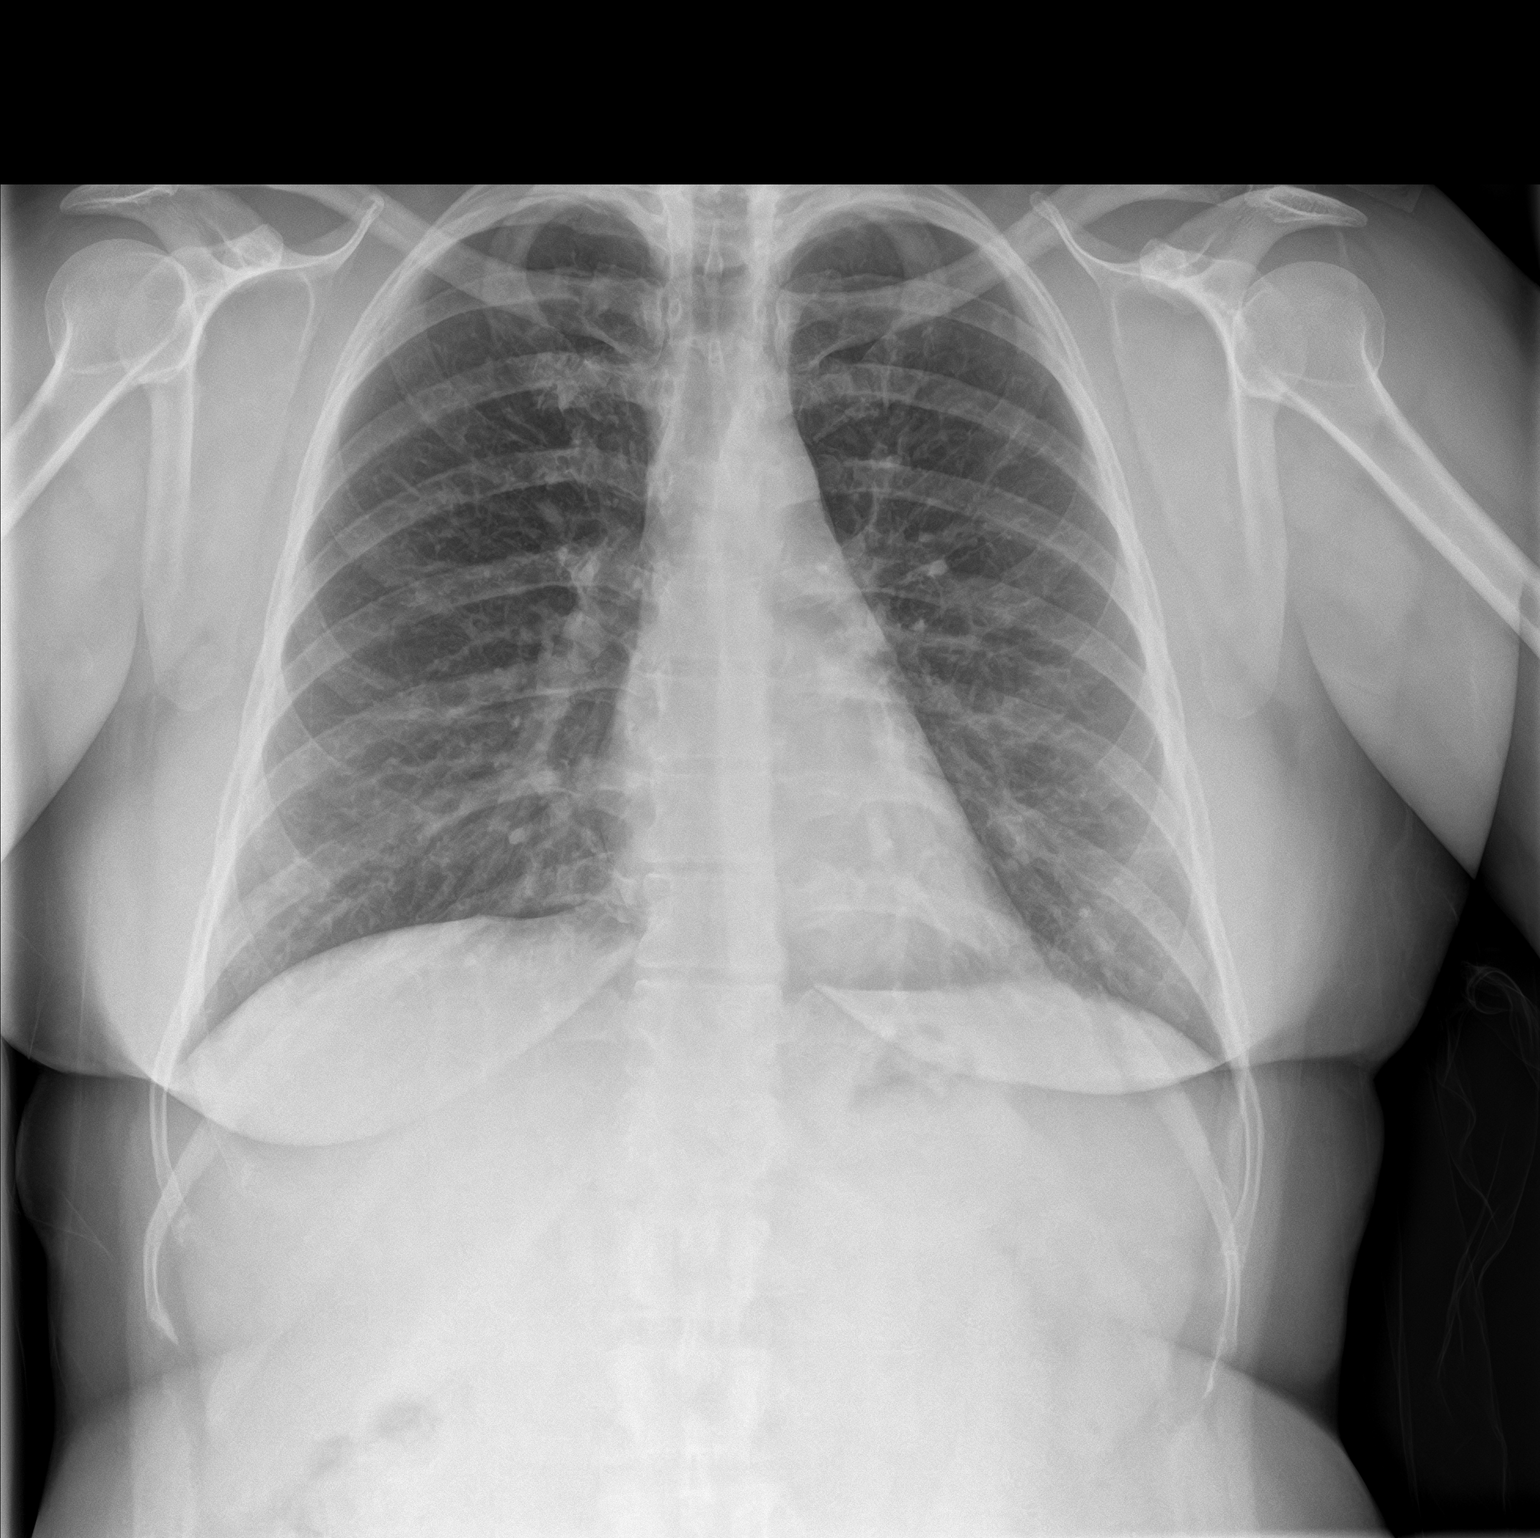
[im 2/2]
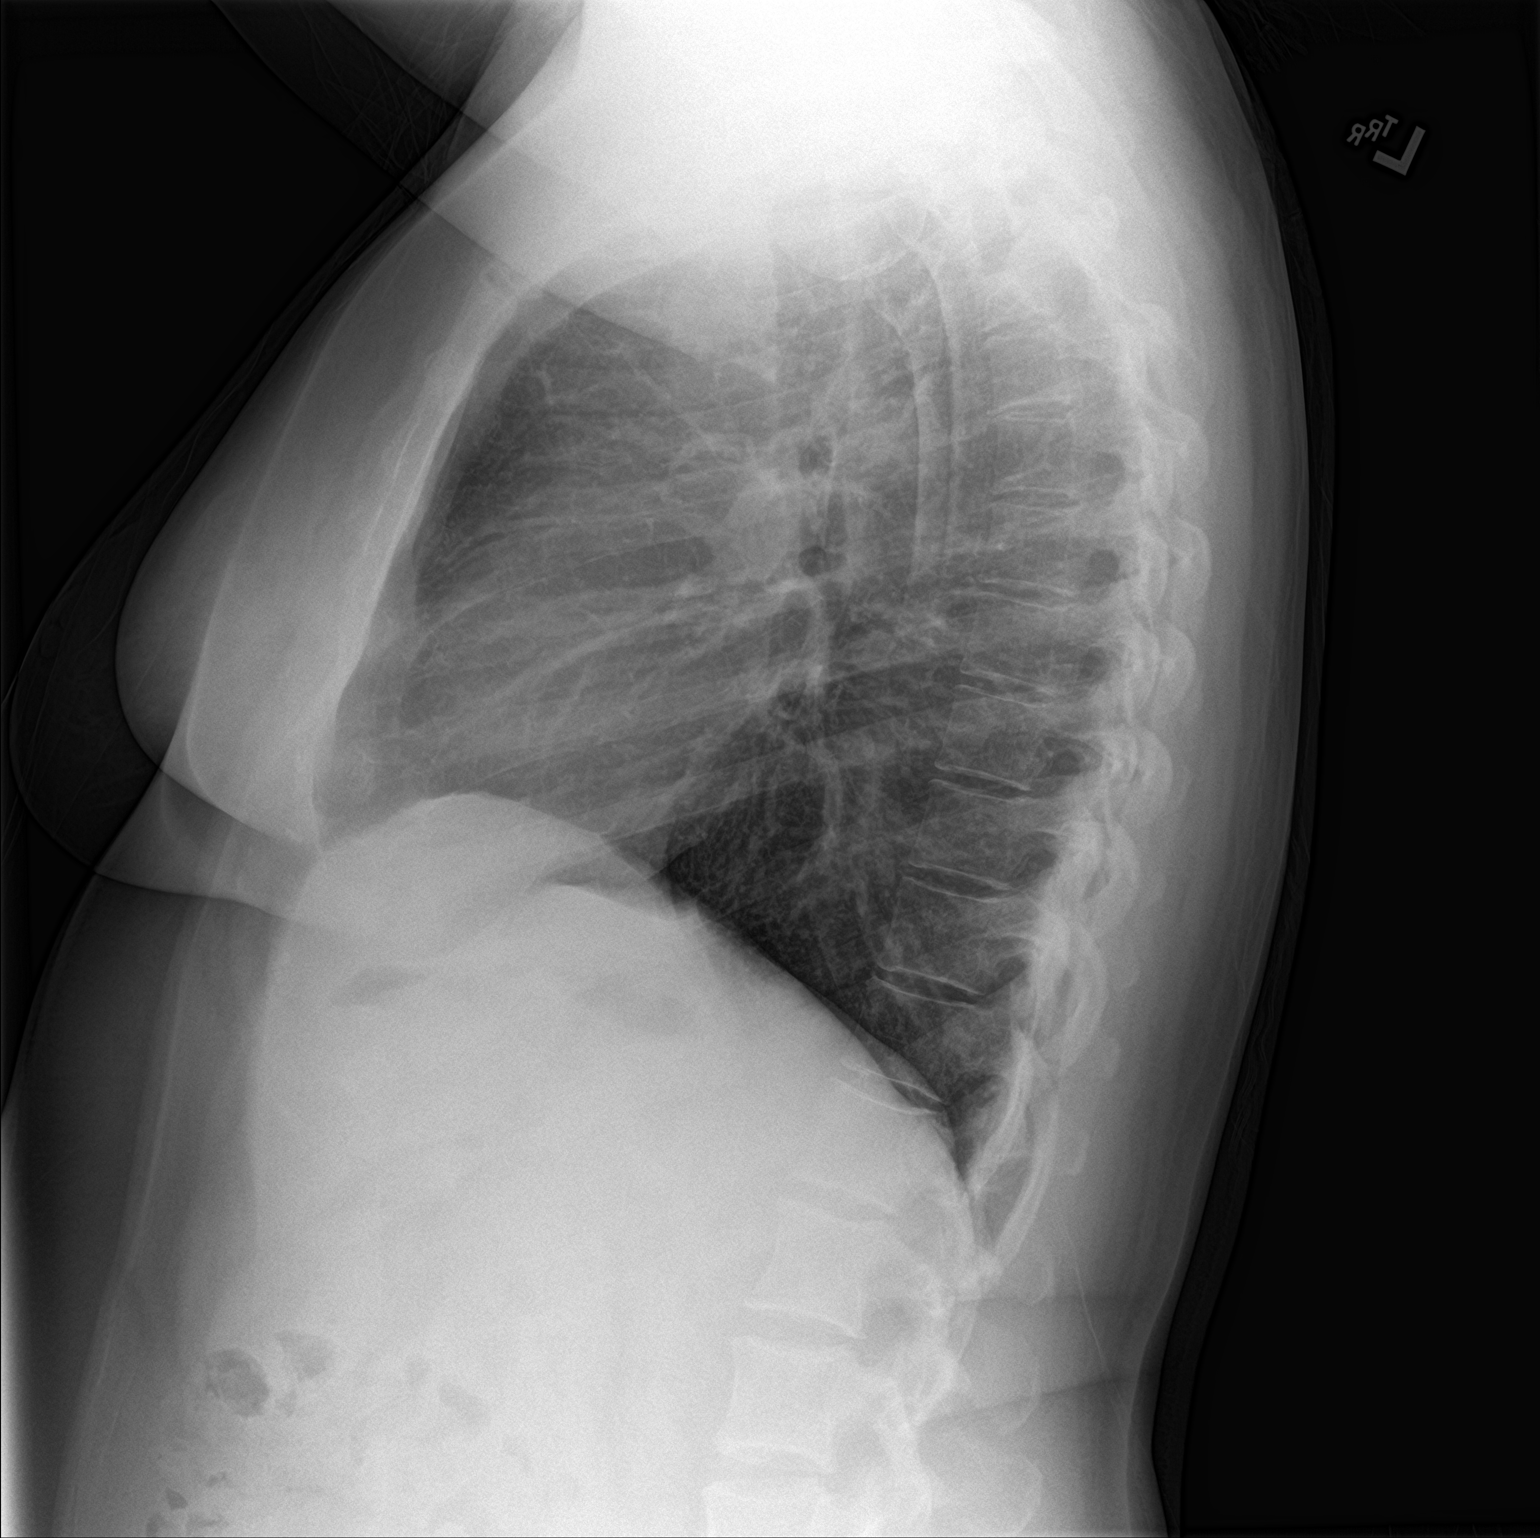

[2 of 2 positions shown; findings below may reference images not displayed]

FINDINGS: The heart size and mediastinal contours are within normal limits.
Both lungs are clear. The visualized skeletal structures are
unremarkable.
IMPRESSION: No active cardiopulmonary disease.
